# Patient Record
Sex: Male | Born: 1960
Health system: Southern US, Community
[De-identification: ages and names within clinical notes are randomized; demographics above are authoritative.]

## PROBLEM LIST (undated history)

## (undated) DIAGNOSIS — K219 Gastro-esophageal reflux disease without esophagitis: Secondary | ICD-10-CM

## (undated) DIAGNOSIS — N200 Calculus of kidney: Secondary | ICD-10-CM

## (undated) DIAGNOSIS — R1310 Dysphagia, unspecified: Secondary | ICD-10-CM

## (undated) DIAGNOSIS — R300 Dysuria: Secondary | ICD-10-CM

## (undated) DIAGNOSIS — Z87442 Personal history of urinary calculi: Secondary | ICD-10-CM

## (undated) DIAGNOSIS — Z8709 Personal history of other diseases of the respiratory system: Secondary | ICD-10-CM

## (undated) DIAGNOSIS — F32A Depression, unspecified: Secondary | ICD-10-CM

## (undated) DIAGNOSIS — R319 Hematuria, unspecified: Secondary | ICD-10-CM

## (undated) DIAGNOSIS — Z9889 Other specified postprocedural states: Secondary | ICD-10-CM

---

## 1998-11-23 ENCOUNTER — Inpatient Hospital Stay (HOSPITAL_COMMUNITY): Admission: EM | Admit: 1998-11-23 | Discharge: 1998-11-27 | Payer: Self-pay | Admitting: Emergency Medicine

## 1998-11-23 ENCOUNTER — Encounter: Payer: Self-pay | Admitting: General Surgery

## 1998-11-23 ENCOUNTER — Encounter: Payer: Self-pay | Admitting: Emergency Medicine

## 1998-11-24 ENCOUNTER — Encounter: Payer: Self-pay | Admitting: General Surgery

## 1998-11-24 ENCOUNTER — Encounter: Payer: Self-pay | Admitting: Emergency Medicine

## 1998-11-25 ENCOUNTER — Encounter: Payer: Self-pay | Admitting: General Surgery

## 1998-11-26 ENCOUNTER — Encounter: Payer: Self-pay | Admitting: General Surgery

## 1998-11-27 ENCOUNTER — Encounter: Payer: Self-pay | Admitting: General Surgery

## 2006-08-11 ENCOUNTER — Ambulatory Visit (HOSPITAL_COMMUNITY): Admission: RE | Admit: 2006-08-11 | Discharge: 2006-08-11 | Payer: Self-pay | Admitting: General Surgery

## 2006-08-11 HISTORY — PX: ANAL FISSURE REPAIR: SHX2312

## 2012-02-20 ENCOUNTER — Ambulatory Visit: Admit: 2012-02-20 | Payer: Self-pay | Admitting: Urology

## 2012-02-20 ENCOUNTER — Emergency Department (HOSPITAL_COMMUNITY)
Admission: EM | Admit: 2012-02-20 | Discharge: 2012-02-20 | Disposition: A | Discharge status: Home / Self Care | Payer: 59 | Attending: Urology | Admitting: Urology

## 2012-02-20 ENCOUNTER — Other Ambulatory Visit: Payer: Self-pay | Admitting: Urology

## 2012-02-20 ENCOUNTER — Encounter (HOSPITAL_COMMUNITY): Admission: EM | Disposition: A | Discharge status: Home / Self Care | Payer: Self-pay | Source: Home / Self Care

## 2012-02-20 ENCOUNTER — Encounter (HOSPITAL_COMMUNITY): Payer: Self-pay | Admitting: Anesthesiology

## 2012-02-20 ENCOUNTER — Emergency Department (HOSPITAL_COMMUNITY): Payer: 59 | Admitting: Anesthesiology

## 2012-02-20 ENCOUNTER — Encounter (HOSPITAL_COMMUNITY): Payer: Self-pay | Admitting: *Deleted

## 2012-02-20 ENCOUNTER — Emergency Department (HOSPITAL_COMMUNITY): Payer: 59

## 2012-02-20 DIAGNOSIS — N1330 Unspecified hydronephrosis: Secondary | ICD-10-CM | POA: Insufficient documentation

## 2012-02-20 DIAGNOSIS — R112 Nausea with vomiting, unspecified: Secondary | ICD-10-CM | POA: Insufficient documentation

## 2012-02-20 DIAGNOSIS — N132 Hydronephrosis with renal and ureteral calculous obstruction: Secondary | ICD-10-CM

## 2012-02-20 DIAGNOSIS — N200 Calculus of kidney: Secondary | ICD-10-CM | POA: Insufficient documentation

## 2012-02-20 DIAGNOSIS — K5900 Constipation, unspecified: Secondary | ICD-10-CM | POA: Insufficient documentation

## 2012-02-20 DIAGNOSIS — R109 Unspecified abdominal pain: Secondary | ICD-10-CM | POA: Insufficient documentation

## 2012-02-20 DIAGNOSIS — N201 Calculus of ureter: Secondary | ICD-10-CM | POA: Insufficient documentation

## 2012-02-20 HISTORY — PX: OTHER SURGICAL HISTORY: SHX169

## 2012-02-20 LAB — SURGICAL PCR SCREEN: MRSA, PCR: NEGATIVE

## 2012-02-20 LAB — COMPREHENSIVE METABOLIC PANEL
ALT: 15 U/L (ref 0–53)
AST: 15 U/L (ref 0–37)
Albumin: 3.9 g/dL (ref 3.5–5.2)
Alkaline Phosphatase: 64 U/L (ref 39–117)
BUN: 15 mg/dL (ref 6–23)
CO2: 23 mEq/L (ref 19–32)
Calcium: 9.4 mg/dL (ref 8.4–10.5)
Chloride: 101 mEq/L (ref 96–112)
Creatinine, Ser: 1.33 mg/dL (ref 0.50–1.35)
GFR calc Af Amer: 72 mL/min — ABNORMAL LOW (ref >90)
GFR calc non Af Amer: 62 mL/min — ABNORMAL LOW (ref >90)
Glucose, Bld: 132 mg/dL — ABNORMAL HIGH (ref 70–99)
Potassium: 3.7 mEq/L (ref 3.5–5.1)
Sodium: 139 mEq/L (ref 135–145)
Total Bilirubin: 0.7 mg/dL (ref 0.3–1.2)
Total Protein: 6.9 g/dL (ref 6.0–8.3)

## 2012-02-20 LAB — DIFFERENTIAL
Basophils Absolute: 0 10*3/uL (ref 0.0–0.1)
Basophils Relative: 0 % (ref 0–1)
Eosinophils Absolute: 0 10*3/uL (ref 0.0–0.7)
Eosinophils Relative: 0 % (ref 0–5)
Lymphocytes Relative: 8 % — ABNORMAL LOW (ref 12–46)
Lymphs Abs: 0.9 10*3/uL (ref 0.7–4.0)
Monocytes Absolute: 1 10*3/uL (ref 0.1–1.0)
Monocytes Relative: 9 % (ref 3–12)
Neutro Abs: 9.7 10*3/uL — ABNORMAL HIGH (ref 1.7–7.7)
Neutrophils Relative %: 83 % — ABNORMAL HIGH (ref 43–77)

## 2012-02-20 LAB — URINALYSIS, ROUTINE W REFLEX MICROSCOPIC
Bilirubin Urine: NEGATIVE
Glucose, UA: NEGATIVE mg/dL
Ketones, ur: 40 mg/dL — AB
Leukocytes, UA: NEGATIVE
Nitrite: NEGATIVE
Protein, ur: NEGATIVE mg/dL
Specific Gravity, Urine: 1.029 (ref 1.005–1.030)
Urobilinogen, UA: 0.2 mg/dL (ref 0.0–1.0)
pH: 5.5 (ref 5.0–8.0)

## 2012-02-20 LAB — CBC
HCT: 41.4 % (ref 39.0–52.0)
Hemoglobin: 14.3 g/dL (ref 13.0–17.0)
MCH: 30.2 pg (ref 26.0–34.0)
MCHC: 34.5 g/dL (ref 30.0–36.0)
MCV: 87.3 fL (ref 78.0–100.0)
Platelets: 289 10*3/uL (ref 150–400)
RBC: 4.74 MIL/uL (ref 4.22–5.81)
RDW: 12.1 % (ref 11.5–15.5)
WBC: 11.6 10*3/uL — ABNORMAL HIGH (ref 4.0–10.5)

## 2012-02-20 LAB — URINE MICROSCOPIC-ADD ON

## 2012-02-20 SURGERY — CYSTOURETEROSCOPY, WITH RETROGRADE PYELOGRAM AND STENT INSERTION
Anesthesia: General | Site: Ureter | Laterality: Left | Wound class: Clean Contaminated

## 2012-02-20 MED ORDER — GLYCOPYRROLATE 0.2 MG/ML IJ SOLN
INTRAMUSCULAR | Status: DC | PRN
  Administered 2012-02-20: .6 mg via INTRAVENOUS

## 2012-02-20 MED ORDER — 0.9 % SODIUM CHLORIDE (POUR BTL) OPTIME
TOPICAL | Status: DC | PRN
  Administered 2012-02-20: 1000 mL

## 2012-02-20 MED ORDER — SULFAMETHOXAZOLE-TMP DS 800-160 MG PO TABS: 1.0000 | ORAL_TABLET | Freq: Two times a day (BID) | ORAL | Status: AC

## 2012-02-20 MED ORDER — SODIUM CHLORIDE 0.9 % IV SOLN
Freq: Once | INTRAVENOUS | Status: AC
  Administered 2012-02-20: 10 mL/h via INTRAVENOUS

## 2012-02-20 MED ORDER — FENTANYL CITRATE 0.05 MG/ML IJ SOLN: 25.0000 ug | INTRAMUSCULAR | Status: DC | PRN

## 2012-02-20 MED ORDER — HYDROMORPHONE HCL PF 1 MG/ML IJ SOLN
1.0000 mg | Freq: Once | INTRAMUSCULAR | Status: AC
  Administered 2012-02-20: 1 mg via INTRAVENOUS
  Filled 2012-02-20: qty 1

## 2012-02-20 MED ORDER — PROMETHAZINE HCL 25 MG/ML IJ SOLN: 6.2500 mg | INTRAMUSCULAR | Status: DC | PRN

## 2012-02-20 MED ORDER — CEFAZOLIN SODIUM 1-5 GM-% IV SOLN: 1.0000 g | INTRAVENOUS | Status: DC

## 2012-02-20 MED ORDER — IOHEXOL 300 MG/ML  SOLN
INTRAMUSCULAR | Status: DC | PRN
  Administered 2012-02-20: 50 mL

## 2012-02-20 MED ORDER — CEFAZOLIN SODIUM 1-5 GM-% IV SOLN
INTRAVENOUS | Status: AC
Start: 2012-02-20 — End: 2012-02-20
  Filled 2012-02-20: qty 50

## 2012-02-20 MED ORDER — ONDANSETRON HCL 4 MG/2ML IJ SOLN
INTRAMUSCULAR | Status: DC | PRN
  Administered 2012-02-20: 4 mg via INTRAVENOUS

## 2012-02-20 MED ORDER — PROPOFOL 10 MG/ML IV EMUL
INTRAVENOUS | Status: DC | PRN
  Administered 2012-02-20: 180 mg via INTRAVENOUS

## 2012-02-20 MED ORDER — BELLADONNA ALKALOIDS-OPIUM 16.2-60 MG RE SUPP
RECTAL | Status: DC | PRN
  Administered 2012-02-20: 1 via RECTAL

## 2012-02-20 MED ORDER — ONDANSETRON HCL 4 MG/2ML IJ SOLN
INTRAMUSCULAR | Status: AC
  Administered 2012-02-20: 4 mg via INTRAVENOUS
  Filled 2012-02-20: qty 2

## 2012-02-20 MED ORDER — DOCUSATE SODIUM 100 MG PO CAPS: 100.0000 mg | ORAL_CAPSULE | Freq: Two times a day (BID) | ORAL | Status: AC

## 2012-02-20 MED ORDER — LIDOCAINE HCL 2 % EX GEL
CUTANEOUS | Status: DC | PRN
  Administered 2012-02-20: 1 via URETHRAL

## 2012-02-20 MED ORDER — LACTATED RINGERS IV SOLN
INTRAVENOUS | Status: DC | PRN
  Administered 2012-02-20: 17:00:00 via INTRAVENOUS

## 2012-02-20 MED ORDER — SODIUM CHLORIDE 0.9 % IR SOLN
Status: DC | PRN
  Administered 2012-02-20: 6000 mL

## 2012-02-20 MED ORDER — FENTANYL CITRATE 0.05 MG/ML IJ SOLN
INTRAMUSCULAR | Status: DC | PRN
  Administered 2012-02-20: 100 ug via INTRAVENOUS

## 2012-02-20 MED ORDER — HYDROMORPHONE HCL PF 1 MG/ML IJ SOLN
1.0000 mg | Freq: Once | INTRAMUSCULAR | Status: AC
  Administered 2012-02-20: 1 mg via INTRAVENOUS

## 2012-02-20 MED ORDER — HYDROMORPHONE HCL PF 1 MG/ML IJ SOLN
INTRAMUSCULAR | Status: AC
  Administered 2012-02-20: 1 mg via INTRAVENOUS
  Filled 2012-02-20: qty 1

## 2012-02-20 MED ORDER — NEOSTIGMINE METHYLSULFATE 1 MG/ML IJ SOLN
INTRAMUSCULAR | Status: DC | PRN
  Administered 2012-02-20: 5 mg via INTRAVENOUS

## 2012-02-20 MED ORDER — ONDANSETRON HCL 4 MG/2ML IJ SOLN
4.0000 mg | Freq: Once | INTRAMUSCULAR | Status: AC
  Administered 2012-02-20: 4 mg via INTRAVENOUS

## 2012-02-20 MED ORDER — HYDROCODONE-ACETAMINOPHEN 5-325 MG PO TABS: 1.0000 | ORAL_TABLET | Freq: Four times a day (QID) | ORAL | Status: AC | PRN

## 2012-02-20 MED ORDER — ROCURONIUM BROMIDE 100 MG/10ML IV SOLN
INTRAVENOUS | Status: DC | PRN
  Administered 2012-02-20: 5 mg via INTRAVENOUS
  Administered 2012-02-20: 20 mg via INTRAVENOUS

## 2012-02-20 MED ORDER — LIDOCAINE HCL (CARDIAC) 20 MG/ML IV SOLN
INTRAVENOUS | Status: DC | PRN
  Administered 2012-02-20: 75 mg via INTRAVENOUS

## 2012-02-20 MED ORDER — MIDAZOLAM HCL 5 MG/5ML IJ SOLN
INTRAMUSCULAR | Status: DC | PRN
  Administered 2012-02-20: 2 mg via INTRAVENOUS

## 2012-02-20 MED ORDER — POLYETHYLENE GLYCOL 3350 17 G PO PACK: 17.0000 g | PACK | Freq: Every day | ORAL | Status: AC

## 2012-02-20 MED ORDER — KETOROLAC TROMETHAMINE 30 MG/ML IJ SOLN
30.0000 mg | Freq: Once | INTRAMUSCULAR | Status: AC
  Administered 2012-02-20: 30 mg via INTRAVENOUS
  Filled 2012-02-20: qty 1

## 2012-02-20 MED ORDER — CEFAZOLIN SODIUM 1-5 GM-% IV SOLN
INTRAVENOUS | Status: DC | PRN
  Administered 2012-02-20: 1 g via INTRAVENOUS

## 2012-02-20 SURGICAL SUPPLY — 19 items
BAG URO CATCHER STRL LF (DRAPE) ×3 IMPLANT
CATH URET 5FR 28IN CONE TIP (BALLOONS) ×1
CATH URET 5FR 28IN OPEN ENDED (CATHETERS) ×3 IMPLANT
CATH URET 5FR 70CM CONE TIP (BALLOONS) ×2 IMPLANT
CLOTH BEACON ORANGE TIMEOUT ST (SAFETY) ×3 IMPLANT
DRAPE CAMERA CLOSED 9X96 (DRAPES) ×3 IMPLANT
GLOVE BIOGEL M STRL SZ7.5 (GLOVE) ×3 IMPLANT
GLOVE BIOGEL PI IND STRL 7.0 (GLOVE) ×2 IMPLANT
GLOVE BIOGEL PI INDICATOR 7.0 (GLOVE) ×1
GLOVE SURG SS PI 7.0 STRL IVOR (GLOVE) ×3 IMPLANT
GLOVE SURG SS PI 8.0 STRL IVOR (GLOVE) IMPLANT
GOWN PREVENTION PLUS XLARGE (GOWN DISPOSABLE) ×3 IMPLANT
GOWN STRL REIN XL XLG (GOWN DISPOSABLE) ×3 IMPLANT
MANIFOLD NEPTUNE II (INSTRUMENTS) ×3 IMPLANT
MARKER SKIN DUAL TIP RULER LAB (MISCELLANEOUS) ×3 IMPLANT
PACK CYSTO (CUSTOM PROCEDURE TRAY) ×3 IMPLANT
SHEATH ACCESS URETERAL 38CM (SHEATH) ×3 IMPLANT
STENT CONTOUR 6FRX26X.038 (STENTS) ×3 IMPLANT
TUBING CONNECTING 10 (TUBING) ×3 IMPLANT

## 2012-02-20 NOTE — Discharge Instructions (Signed)
Ureteral Stent  A ureteral stent is a soft plastic tube with multiple holes. The stent is inserted into a ureter to help drain urine from the kidney into the bladder. The tube has a coil on each end to keep it from falling out. One end stays in the kidney. The other end stays in the bladder. A stent cannot be seen from the outside. Usually it does not keep you from going about normal routines.  A ureteral stent is used to bypass a blockage in your kidney or ureter. This blockage can be caused by kidney stones, scar tissue, pregnancy, or other causes. It can also be used during treatment to remove a kidney stone or to let a ureter heal after surgery. The stent allows urine to drain from the kidney into the bladder. It is most often taken out after the blockage has been removed or the ureter has healed. If a stent is needed for a long time, it will be changed every few months.  INSERTING THE STENT  Your stent is put in by a urologist. This is a medical doctor trained for treating genitourinary (kidney, ureter and bladder) problems. Before your stent is put in, your caregiver may order x-rays or other imaging tests of your kidneys and ureters. The stent is inserted in a hospital or same day surgical center. You can anticipate going home the same day.  PROCEDURE   A special x-ray machine called a fluoroscope is used to guide the insertion of your stent. This allows your doctor to make sure the stent is in the correct place.   First you are given anesthesia to keep you comfortable.   Then your doctor inserts a special lighted instrument called a cystoscope into your bladder. This allows your doctor to see the opening to the ureter.   A thin wire is carefully threaded into the bladder and up the ureter. The stent is inserted over the wire and the wire is then removed.  HOME CARE INSTRUCTIONS    While the stent is in place, you may feel some discomfort. Certain movements may trigger pain or a feeling that you need to  urinate. Your caregiver may give you pain medication. Only take over-the-counter or prescription medicines for pain, discomfort, or fever as directed by your caregiver. Do not take aspirin as this can make bleeding worse.   You may be given medications to prevent infection or bladder spasms. Be sure to take all medications as directed.   Drink plenty of fluids.   You may have small amounts of bleeding causing your urine to be slightly red. This is nothing to be concerned about.  REMOVAL OF THE STENT  Your stent is left in until the blockage is resolved. This may take two weeks or longer. Before the stent is removed, you may have an x-ray make sure the ureter is open. The stent can be removed by your caregiver in the office. Medications may be given for comfort. Be sure to keep all follow-up appointments so your caregiver can check that you are healing properly.  SEEK IMMEDIATE MEDICAL CARE IF:    Your urine is dark red or has blood clots.   You are incontinent (leaking urine).   You have an oral temperature above 102 F (38.9 C), chills, nausea (feeling sick to your stomach), or vomiting.   Your pain is not relieved by pain medication. Do not take aspirin as this can make bleeding worse.   The end of the stent comes   out of the urethra.  Document Released: 09/05/2000 Document Revised: 08/28/2011 Document Reviewed: 09/04/2008  ExitCare Patient Information 2012 [employer].

## 2012-02-20 NOTE — Anesthesia Postprocedure Evaluation (Signed)
  Anesthesia Post-op Note  Patient: Joe Higgins  Procedure(s) Performed: Procedure(s) (LRB): CYSTOSCOPY WITH RETROGRADE PYELOGRAM, URETEROSCOPY AND STENT PLACEMENT (Left)  Patient Location: PACU  Anesthesia Type: General  Level of Consciousness: awake and alert   Airway and Oxygen Therapy: Patient Spontanous Breathing  Post-op Pain: mild  Post-op Assessment: Post-op Vital signs reviewed, Patient's Cardiovascular Status Stable, Respiratory Function Stable, Patent Airway and No signs of Nausea or vomiting  Post-op Vital Signs: stable  Complications: No apparent anesthesia complications

## 2012-02-20 NOTE — ED Notes (Addendum)
Pt was seen at [facility] urology on Wednesday. Dx with large kidney stone and told "it might not pass" -- was given pain management and d/c from there to return today. Pain became unbearable without relief at 0530. Pt is unable to keep percocet down. Pt is rocking, moaning with pain on arrival. Accompanied by wife.

## 2012-02-20 NOTE — Transfer of Care (Signed)
Immediate Anesthesia Transfer of Care Note  Patient: Joe Higgins  Procedure(s) Performed: Procedure(s) (LRB): CYSTOSCOPY WITH RETROGRADE PYELOGRAM, URETEROSCOPY AND STENT PLACEMENT (Left)  Patient Location: PACU  Anesthesia Type: General  Level of Consciousness: awake, oriented and patient cooperative  Airway & Oxygen Therapy: Patient Spontanous Breathing and Patient connected to face mask oxygen  Post-op Assessment: Report given to PACU RN, Post -op Vital signs reviewed and stable and Patient moving all extremities X 4  Post vital signs: stable  Complications: No apparent anesthesia complications

## 2012-02-20 NOTE — Anesthesia Preprocedure Evaluation (Addendum)

## 2012-02-20 NOTE — Brief Op Note (Signed)
02/20/2012  6:29 PM  PATIENT:  Joe Higgins  51 y.o. male  PRE-OPERATIVE DIAGNOSIS:  left ureteral stone  POST-OPERATIVE DIAGNOSIS:  Left ureteral stone  PROCEDURE:  Procedure(s) (LRB): CYSTOSCOPY WITH RETROGRADE PYELOGRAM, URETEROSCOPY AND STENT PLACEMENT (Left)  SURGEON:  Surgeon(s) and Role:    * Antony Haste, MD - Primary   ANESTHESIA:   general  EBL:  Minimal  BLOOD ADMINISTERED:none  DRAINS: left ureteral stent  LOCAL MEDICATIONS USED:  OTHER Lidojet urethra, B&O PR  DICTATION: [id number]  PLAN OF CARE: Discharge to home after PACU  PATIENT DISPOSITION:  PACU - hemodynamically stable.   Delay start of Pharmacological VTE agent (>24hrs) due to surgical blood loss or risk of bleeding: not applicable

## 2012-02-20 NOTE — ED Provider Notes (Signed)
Medical screening examination/treatment/procedure(s) were performed by non-physician practitioner and as supervising physician I was immediately available for consultation/collaboration.  Lauri Till, MD 02/20/12 1558 

## 2012-02-20 NOTE — H&P (Signed)
History of Present Illness           New patient seen in consult for a 4 mm left ureteral stone referred by Christopher Lawyer PA from [facility] ER. Pt developed acute onset of left flank pain about two days ago. He was seen at [facility] emergency department in [place] while at a job and was diagnosed with a 4 mm left ureteral stone. This was reviewed from his official ER discharge paperwork. This is his fisrt stone. Pain is severe and intermittent. He started on tamsulosin and pain medicines. He's been having emesis today, has uncontrolled pain and been to the ER twice. He got toradol earlier.   He has no dysuria and AUASS = 0.   A kub was done Feb 20, 2012 which revealed no acute abnormality. Bowel gas obscures the renal shadows. No large calculi identified.  There are no other associated signs or symptoms. There are no other aggravating or alleviating factors.   Past Medical History Problems  1. History of  Heartburn 787.1 2. History of  Peptic Ulcer V12.71  Surgical History Problems  1. History of  Chest Tube Insertion 2. History of  Rectal Surgery  Current Meds 1. Flomax 0.4 MG Oral Capsule; Therapy: (Recorded:31May2013) to 2. Percocet TABS; Therapy: (Recorded:31May2013) to 3. Zofran 4 MG Oral Tablet; Therapy: (Recorded:31May2013) to  Allergies Medication  1. No Known Drug Allergies  Family History Problems  1. Family history of  Family Health Status - Father's Age 65 2. Family history of  Family Health Status - Mother's Age 62 3. Family history of  Family Health Status Number Of Children 2 daughters 4. Paternal history of  Prostate Cancer V16.42 mgf  Social History Problems    Caffeine Use 1   Marital History - Currently Married   Never A Smoker Denied    History of  Alcohol Use  Review of Systems Genitourinary, constitutional, skin, eye, otolaryngeal, hematologic/lymphatic, cardiovascular, pulmonary, endocrine,  musculoskeletal, gastrointestinal, neurological and psychiatric system(s) were reviewed and pertinent findings if present are noted.  Gastrointestinal: nausea, vomiting and constipation.  Neurological: headache.    Vitals Vital Signs [Data Includes: Last 1 Day]  31May2013 02:04PM  BMI Calculated: 23.85 BSA Calculated: 1.88 Height: 5 ft 9 in Weight: 161 lb  Blood Pressure: 122 / 71 Temperature: 97.8 F Heart Rate: 71 Respiration: 18  Physical Exam Constitutional: Well nourished and well developed . No acute distress. Rocking around and looks in pain.  ENT:. The ears and nose are normal in appearance.  Neck: The appearance of the neck is normal and no neck mass is present.  Pulmonary: No respiratory distress and normal respiratory rhythm and effort.  Cardiovascular: Heart rate and rhythm are normal . No peripheral edema.  Abdomen: The abdomen is soft and nontender. No masses are palpated. No CVA tenderness. No hernias are palpable. No hepatosplenomegaly noted.  Lymphatics: The femoral and inguinal nodes are not enlarged or tender.  Skin: Normal skin turgor, no visible rash and no visible skin lesions.  Neuro/Psych:. Mood and affect are appropriate.    Results/Data Urine [Data Includes: Last 1 Day]   31May2013  COLOR AMBER   APPEARANCE CLEAR   SPECIFIC GRAVITY 1.025   pH 5.5   GLUCOSE NEG mg/dL  BILIRUBIN NEG   KETONE 15 mg/dL  BLOOD LARGE   PROTEIN TRACE mg/dL  UROBILINOGEN 0.2 mg/dL  NITRITE NEG   LEUKOCYTE ESTERASE NEG   SQUAMOUS EPITHELIAL/HPF NONE SEEN   WBC 0-3   WBC/hpf  RBC 11-20 RBC/hpf  BACTERIA NONE SEEN   CRYSTALS Calcium Oxalate crystals noted   CASTS NONE SEEN    Old records or history reviewed: 3 pages + epic chart.    Discussion/Summary     Using the understanding kidney stone handout we discussed the nature , R/B of continued stone passage with MET, cysto/left RGP/ureteral stent poss URS/laserlitho, or ESWL. All questions answered. He elects to proceed  with cysto/left RGP/ureteral stent poss URS/laserlitho. We also discussed the side effects of the proposed treatment, the likelihood of the patient achieving the goals of the procedure, and any potential problems that might occur during the procedure or recuperation. We discussed the need for a staged procedure with stent placement followed by stone treatment in the future.  

## 2012-02-20 NOTE — ED Provider Notes (Signed)
History     CSN: [id number]  Arrival date & time 02/20/12  0818   First MD Initiated Contact with Patient 02/20/12 0831      No chief complaint on file.   (Consider location/radiation/quality/duration/timing/severity/associated sxs/prior treatment) HPI Patient presents emergency Dept. with significant left flank pain began around 4 AM.  Patient states he was seen at another hospital in [place] while on a job site and was diagnosed with a 4 mm kidney stone and sent home with pain control and antiemetics.  Patient states that his pain came unbearable around 5:30 in the troponin to the emergency department.  Patient denies difficulty urinating, fever, weakness, or diarrhea. The patient states that he has had some constipation as well. No past medical history on file.  No past surgical history on file.  No family history on file.  History  Substance Use Topics  . Smoking status: Not on file  . Smokeless tobacco: Not on file  . Alcohol Use: Not on file      Review of Systems All other systems negative except as documented in the HPI. All pertinent positives and negatives as reviewed in the HPI.  Allergies  Review of patient's allergies indicates no known allergies.  Home Medications   Current Outpatient Rx  Name Route Sig Dispense Refill  . MULTI-VITAMIN/MINERALS PO TABS Oral Take 1 tablet by mouth daily.    Marland Kitchen ONDANSETRON HCL 4 MG PO TABS Oral Take 4 mg by mouth every 6 (six) hours as needed. nausea    . OXYCODONE-ACETAMINOPHEN 5-325 MG PO TABS Oral Take 1 tablet by mouth every 4 (four) hours as needed. pain    . TAMSULOSIN HCL 0.4 MG PO CAPS Oral Take 0.4 mg by mouth daily.      BP 154/90  Pulse 91  Temp(Src) 97.7 F (36.5 C) (Oral)  Resp 24  Ht 5\' 9"  (1.753 m)  Wt 157 lb (71.215 kg)  BMI 23.18 kg/m2  SpO2 100%  Physical Exam  Nursing note and vitals reviewed. Constitutional: He is oriented to person, place, and time. He appears well-developed and  well-nourished. He appears distressed.  HENT:  Head: Normocephalic and atraumatic.  Eyes: Pupils are equal, round, and reactive to light.  Neck: Normal range of motion. Neck supple.  Cardiovascular: Normal rate, regular rhythm and normal heart sounds.   Pulmonary/Chest: Effort normal and breath sounds normal.  Abdominal: Soft. He exhibits no distension. There is tenderness. There is no rebound and no guarding.  Neurological: He is alert and oriented to person, place, and time.  Skin: Skin is warm and dry.    ED Course  Procedures (including critical care time)  Labs Reviewed  CBC - Abnormal; Notable for the following:    WBC 11.6 (*)    All other components within normal limits  DIFFERENTIAL - Abnormal; Notable for the following:    Neutrophils Relative 83 (*)    Neutro Abs 9.7 (*)    Lymphocytes Relative 8 (*)    All other components within normal limits  COMPREHENSIVE METABOLIC PANEL - Abnormal; Notable for the following:    Glucose, Bld 132 (*)    GFR calc non Af Amer 62 (*)    GFR calc Af Amer 72 (*)    All other components within normal limits  URINALYSIS, ROUTINE W REFLEX MICROSCOPIC - Abnormal; Notable for the following:    Hgb urine dipstick SMALL (*)    Ketones, ur 40 (*)    All other components within normal  limits  URINE MICROSCOPIC-ADD ON - Abnormal; Notable for the following:    Bacteria, UA FEW (*)    Crystals CA OXALATE CRYSTALS (*)    All other components within normal limits   Dg Abd 1 View  02/20/2012  *RADIOLOGY REPORT*  Clinical Data: Left-sided abdominal pain.  Left-sided kidney stone.  ABDOMEN - 1 VIEW  Comparison: None.  Findings: Gaseous distention of large and small bowel.  Prominent stool is present in the ascending colon.  The renal outlines are obscured by overlying bowel gas.  There is a small calcification in the right upper quadrant superior to the right eleventh rib which is nonspecific.  IMPRESSION: No acute abnormality.  Bowel gas obscures the  renal shadows.  No large calculi identified.  Original Report Authenticated By: Andreas Newport, M.D.   I spoke with Dr. Mena Goes from urology and he advised a plain film would be ultimately be needed at this point.  If his pain is controlled to be stable he most likely can be discharged.    12:32 PM Patient is completely pain-free at this time.  I spoke with [facility], who will see the patient at 1:30 today.   MDM  MDM Reviewed: nursing note and vitals Reviewed previous: CT scan and labs Interpretation: labs and x-ray Consults: urology            Carlyle Dolly, PA-C 02/20/12 1253

## 2012-02-20 NOTE — Anesthesia Procedure Notes (Signed)
Procedure Name: Intubation Date/Time: 02/20/2012 5:50 PM Performed by: Trellis Paganini Oxygen Delivery Method: Circle system utilized Preoxygenation: Pre-oxygenation with 100% oxygen Intubation Type: IV induction and Cricoid Pressure applied Laryngoscope Size: Mac and 4 Grade View: Grade I Tube type: Oral Tube size: 7.5 mm Number of attempts: 1 Placement Confirmation: ETT inserted through vocal cords under direct vision,  breath sounds checked- equal and bilateral and positive ETCO2 Secured at: 21 cm Tube secured with: Tape Dental Injury: Teeth and Oropharynx as per pre-operative assessment

## 2012-02-20 NOTE — ED Notes (Signed)
Patient given a urinal and made aware of need for urine sample.  

## 2012-02-21 NOTE — Op Note (Signed)
NAMEYANI, LAL NO.:  [id number]  MEDICAL RECORD NO.:  [id number]  LOCATION:  [facility]                         FACILITY:  [facility]  PHYSICIAN:  Jerilee Field, MD   DATE OF BIRTH:  1961/02/14  DATE OF PROCEDURE: DATE OF DISCHARGE:  02/20/2012                              OPERATIVE REPORT   PREOPERATIVE DIAGNOSIS:  Left ureteral stone.  POSTOPERATIVE DIAGNOSIS:  Left ureteral stone.  PROCEDURE:  Cystoscopy with left retrograde pyelogram, left ureteroscopy, and left ureteral stent placement.  SURGEON:  Jerilee Field, MD  ANESTHESIA:  General.  INDICATION FOR PROCEDURE:  Mr. Rowe is a 51 year old, white male, who developed left flank pain approximately 2 days ago.  He has been to the emergency room twice with uncontrolled pain.  He had nausea and vomiting today.  The CT scan revealed a 4 mm left ureteral stone.  I discussed with the patient the nature, risks, benefits of cystoscopy, left ureteral stent placement possible ureteroscopy, shockwave lithotripsy, or continued stone passage with medical expulsion therapy.  All questions answered.  The patient elected to proceed with the endoscopic management.  FINDINGS: 1. On exam under anesthesia, the patient had a normal penis and     testicles without hard areas or mass.  He also had a normal digital     rectal exam with a mild prostate enlargement, but no hard area or     nodule.  On cystoscopy, the urethra and bladder were normal. 2. Left retrograde pyelogram.  The left ureter was normal in     appearance apart from a 4 mm filling defect in the left proximal     ureter consistent with the known ureteral stone.  This was just     adjacent to L4 vertebral body.  Proximal to this, there was mild     dilation of the ureter, but a normal complement of upper, middle,     and lower pole calices, and a normal renal pelvis without filling     defect.  DESCRIPTION OF PROCEDURE:  After consent was obtained, the  patient brought to the operating room.  A time-out was performed to confirm the patient and procedure.  After adequate anesthesia, he was placed in lithotomy position.  An exam under anesthesia was performed.  The findings as previously dictated.  He was then prepped and draped in the usual fashion and a rigid cystoscope passed per urethra.  The left ureteral orifice was quite tight.  Therefore, using a cone-tipped catheter, left retrograde pyelogram was obtained with the left retrograde injection of contrast.  Following this, a Sensor wire was advanced up to the level of the stone where it met significant resistance.  The Sensor wire would not pass through the proximal ureter at the location of the stone and the backload of 6-French open-ended catheter over the wire to brace the wire when the wire then finely sprung through and went into the mid pole of the kidney.  Contrast was in the kidney, pelvis and normal proximal ureter which confirmed wire placement.  Next, I tried to advance the semirigid ureteroscope, but the ureteral orifice was too tight.  Therefore, the inner cannula of  an access sheath was used to dilate the ureteral orifice, but this would not pass proximal to the iliac vessels.  The ureter took quite a turn at the iliac vessels.  The semirigid ureteroscope was advanced back into the distal ureter and I was able to go up to the level of the iliacs, but again ureter was too tight and the scope would not pass.  The cystoscope and dilation did not pass at his point.  I then want to force an access sheath up.  Therefore, the wire was backloaded on the cystoscope and a 6 x 26 cm stent was placed.  The wire was removed and a good coil was seen in the kidney and a good coil in the bladder.  The patient's bladder was drained.  Scope was removed.  He was then awakened and taken to the recovery room in stable condition.  ESTIMATED BLOOD LOSS:  Minimal.  COMPLICATIONS:   None.  DRAINS:  Left ureteral stent.  DISPOSITION:  The patient is stable to PACU.          ______________________________ Jerilee Field, MD     ME/MEDQ  D:  02/20/2012  T:  02/21/2012  Job:  [id number]

## 2012-02-23 MED FILL — Mupirocin Oint 2%: CUTANEOUS | Qty: 22 | Status: AC

## 2012-02-24 NOTE — OR Nursing (Signed)
I added the stent to the charges. It was documented in the [facility] section but not supplies.

## 2012-03-03 ENCOUNTER — Encounter (HOSPITAL_BASED_OUTPATIENT_CLINIC_OR_DEPARTMENT_OTHER): Payer: Self-pay | Admitting: *Deleted

## 2012-03-03 ENCOUNTER — Other Ambulatory Visit: Payer: Self-pay | Admitting: Urology

## 2012-03-05 ENCOUNTER — Encounter (HOSPITAL_BASED_OUTPATIENT_CLINIC_OR_DEPARTMENT_OTHER): Payer: Self-pay | Admitting: *Deleted

## 2012-03-05 NOTE — Progress Notes (Signed)
NPO AFTER MN. ARRIVES AT 0615. NEEDS HG AND EKG. MAY TAKE RX PAIN/ NAUSEA IF NEEDED W/ SIPS OF WATER.

## 2012-03-08 NOTE — H&P (Signed)
History of Present Illness   F/u ureteral stone (4 mm left ureteral stone) referred by Charlestine Night PA from [facility] ER. Pt developed acute onset of left flank pain May 2013. He was seen at [facility] emergency department in [place] while at a job and was diagnosed with a 4 mm left ureteral stone. This was reviewed from his official ER discharge paperwork. This is his first stone.   A kub was done Feb 20, 2012 which revealed no acute abnormality. Bowel gas obscures the renal shadows. No large calculi identified.  Taken to OR Feb 20, 2012 for left RGP, left ureteral stent. Stone not visible on scout again due to constipation and bowel gas. He had a normal exam under anesthesia/DRE  KUB March 02, 2012 -  Left ureteral stent is in good position, stone may be in left UP. Normal bowel gas pattern. Normal bones.   Past Medical History Problems  1. History of  Heartburn 787.1 2. History of  Peptic Ulcer V12.71  Surgical History Problems  1. History of  Chest Tube Insertion 2. History of  Cystoscopy With Insertion Of Ureteral Stent Left 3. History of  Cystoscopy With Ureteroscopy Left 4. History of  Rectal Surgery  Current Meds 1. Hydrocodone-Acetaminophen 5-325 MG Oral Tablet; Therapy: 01Jun2013 to  Allergies Medication  1. No Known Drug Allergies  Family History Problems  1. Family history of  Family Health Status - Father's Age 79 2. Family history of  Family Health Status - Mother's Age 51 3. Family history of  Family Health Status Number Of Children 2 daughters 4. Paternal history of  Prostate Cancer V16.42 mgf  Social History Problems  1. Caffeine Use 1 2. Marital History - Currently Married 3. Never A Smoker Denied  4. History of  Alcohol Use  Review of Systems Constitutional, cardiovascular and pulmonary system(s) were reviewed and pertinent findings if present are noted.  Musculoskeletal: back pain.    Vitals Vital Signs  [Data Includes: Last 1 Day]  11Jun2013 02:58PM  Blood Pressure: 123 / 74 Temperature: 97.7 F Heart Rate: 74  Physical Exam Constitutional: Well nourished and well developed . No acute distress.  Pulmonary: No respiratory distress and normal respiratory rhythm and effort.  Cardiovascular: Heart rate and rhythm are normal . No peripheral edema.  Neuro/Psych:. Mood and affect are appropriate.    Results/Data Urine [Data Includes: Last 1 Day]   11Jun2013  COLOR AMBER   APPEARANCE CLOUDY   SPECIFIC GRAVITY 1.025   pH 6.0   GLUCOSE NEG mg/dL  BILIRUBIN NEG   KETONE NEG mg/dL  BLOOD LARGE   PROTEIN 100 mg/dL  UROBILINOGEN 0.2 mg/dL  NITRITE NEG   LEUKOCYTE ESTERASE SMALL   SQUAMOUS EPITHELIAL/HPF FEW   WBC 4-6 WBC/hpf  RBC TNTC RBC/hpf  BACTERIA FEW   CRYSTALS NONE SEEN   CASTS NONE SEEN    Assessment Assessed  1. Nephrolithiasis 592.0 2. Pyuria 791.9  Plan  Health Maintenance (V70.0)  1. UA With REFLEX  Done: 11Jun2013 02:31PM Nephrolithiasis (592.0)  2. Follow-up Schedule Surgery Office  Follow-up  Requested for: 11Jun2013  URINE CULTURE  Status: In Progress - Specimen/Data Collected  Done: 11Jun2013 Ordered Today; For: Nephrolithiasis (592.0), Pyuria (791.9); Ordered By: Jamie Kato  Due: 13Jun2013 Marked Important; Last Updated By: Norberto Sorenson   Discussion/Summary      I believe I can see the stone on the KUB but it is on the lower limit of a stone  that is visible for confident shockwave lithotripsy. Using the understanding kidney stones handout we discussed the nature risks and benefits to nontreatment/removing the stent, left ureteroscopy laser lithotripsy, or left shockwave lithotripsy. All questions answered and they agree to proceed with left ureteroscopy laser lithotripsy and stent placement. All questions answered. We'll set this up for the near future.  Pt is here with his wife. We also discussed right renal stone is rather high on the  KUB up above the 11th rib. Therefore asked them to get a copy of his CT images from [facility] so that I may review the images. Urine Cx was negative.

## 2012-03-09 ENCOUNTER — Ambulatory Visit (HOSPITAL_BASED_OUTPATIENT_CLINIC_OR_DEPARTMENT_OTHER): Payer: Managed Care, Other (non HMO) | Admitting: Anesthesiology

## 2012-03-09 ENCOUNTER — Encounter (HOSPITAL_BASED_OUTPATIENT_CLINIC_OR_DEPARTMENT_OTHER): Payer: Self-pay | Admitting: Anesthesiology

## 2012-03-09 ENCOUNTER — Encounter (HOSPITAL_BASED_OUTPATIENT_CLINIC_OR_DEPARTMENT_OTHER): Payer: Self-pay | Admitting: *Deleted

## 2012-03-09 ENCOUNTER — Encounter (HOSPITAL_BASED_OUTPATIENT_CLINIC_OR_DEPARTMENT_OTHER)
Admission: RE | Disposition: A | Discharge status: Home / Self Care | Payer: Self-pay | Source: Ambulatory Visit | Attending: Urology

## 2012-03-09 ENCOUNTER — Ambulatory Visit (HOSPITAL_BASED_OUTPATIENT_CLINIC_OR_DEPARTMENT_OTHER)
Admission: RE | Admit: 2012-03-09 | Discharge: 2012-03-09 | Disposition: A | Discharge status: Home / Self Care | Payer: Managed Care, Other (non HMO) | Source: Ambulatory Visit | Attending: Urology | Admitting: Urology

## 2012-03-09 DIAGNOSIS — R12 Heartburn: Secondary | ICD-10-CM | POA: Insufficient documentation

## 2012-03-09 DIAGNOSIS — Z79899 Other long term (current) drug therapy: Secondary | ICD-10-CM | POA: Insufficient documentation

## 2012-03-09 DIAGNOSIS — Z8711 Personal history of peptic ulcer disease: Secondary | ICD-10-CM | POA: Insufficient documentation

## 2012-03-09 DIAGNOSIS — N201 Calculus of ureter: Secondary | ICD-10-CM | POA: Insufficient documentation

## 2012-03-09 HISTORY — DX: Calculus of kidney: N20.0

## 2012-03-09 HISTORY — DX: Dysuria: R30.0

## 2012-03-09 HISTORY — DX: Gastro-esophageal reflux disease without esophagitis: K21.9

## 2012-03-09 HISTORY — DX: Hematuria, unspecified: R31.9

## 2012-03-09 LAB — POCT HEMOGLOBIN-HEMACUE: Hemoglobin: 16 g/dL (ref 13.0–17.0)

## 2012-03-09 SURGERY — CYSTOURETEROSCOPY, WITH RETROGRADE PYELOGRAM AND STENT INSERTION
Anesthesia: General | Site: Ureter | Laterality: Left | Wound class: Clean Contaminated

## 2012-03-09 MED ORDER — IOHEXOL 350 MG/ML SOLN
INTRAVENOUS | Status: DC | PRN
  Administered 2012-03-09: 1 mL via INTRAVENOUS

## 2012-03-09 MED ORDER — OXYCODONE-ACETAMINOPHEN 5-325 MG PO TABS: 1.0000 | ORAL_TABLET | ORAL | Status: AC | PRN

## 2012-03-09 MED ORDER — CEFAZOLIN SODIUM 1-5 GM-% IV SOLN
1.0000 g | INTRAVENOUS | Status: AC
  Administered 2012-03-09: 1 g via INTRAVENOUS

## 2012-03-09 MED ORDER — PROPOFOL 10 MG/ML IV EMUL
INTRAVENOUS | Status: DC | PRN
  Administered 2012-03-09: 200 mg via INTRAVENOUS

## 2012-03-09 MED ORDER — LIDOCAINE HCL (CARDIAC) 20 MG/ML IV SOLN
INTRAVENOUS | Status: DC | PRN
  Administered 2012-03-09: 75 mg via INTRAVENOUS

## 2012-03-09 MED ORDER — OXYCODONE-ACETAMINOPHEN 5-325 MG PO TABS
1.0000 | ORAL_TABLET | ORAL | Status: DC | PRN
  Administered 2012-03-09: 1 via ORAL

## 2012-03-09 MED ORDER — CIPROFLOXACIN HCL 250 MG PO TABS: 250.0000 mg | ORAL_TABLET | Freq: Two times a day (BID) | ORAL | Status: AC

## 2012-03-09 MED ORDER — FENTANYL CITRATE 0.05 MG/ML IJ SOLN: 25.0000 ug | INTRAMUSCULAR | Status: DC | PRN

## 2012-03-09 MED ORDER — LACTATED RINGERS IV SOLN
INTRAVENOUS | Status: DC
  Administered 2012-03-09 (×3): via INTRAVENOUS

## 2012-03-09 MED ORDER — DEXAMETHASONE SODIUM PHOSPHATE 4 MG/ML IJ SOLN
INTRAMUSCULAR | Status: DC | PRN
  Administered 2012-03-09: 10 mg via INTRAVENOUS

## 2012-03-09 MED ORDER — FENTANYL CITRATE 0.05 MG/ML IJ SOLN
INTRAMUSCULAR | Status: DC | PRN
  Administered 2012-03-09: 50 ug via INTRAVENOUS
  Administered 2012-03-09 (×3): 25 ug via INTRAVENOUS
  Administered 2012-03-09: 50 ug via INTRAVENOUS
  Administered 2012-03-09: 25 ug via INTRAVENOUS

## 2012-03-09 MED ORDER — SODIUM CHLORIDE 0.9 % IR SOLN
Status: DC | PRN
  Administered 2012-03-09: 6000 mL

## 2012-03-09 MED ORDER — ONDANSETRON HCL 4 MG/2ML IJ SOLN
INTRAMUSCULAR | Status: DC | PRN
  Administered 2012-03-09: 4 mg via INTRAVENOUS

## 2012-03-09 SURGICAL SUPPLY — 33 items
ADAPTER CATH URET PLST 4-6FR (CATHETERS) IMPLANT
BAG DRAIN URO-CYSTO SKYTR STRL (DRAIN) ×3 IMPLANT
BASKET LASER NITINOL 1.9FR (BASKET) IMPLANT
BASKET STNLS GEMINI 4WIRE 3FR (BASKET) ×3 IMPLANT
BASKET ZERO TIP NITINOL 2.4FR (BASKET) ×3 IMPLANT
BRUSH URET BIOPSY 3F (UROLOGICAL SUPPLIES) IMPLANT
CANISTER SUCT LVC 12 LTR MEDI- (MISCELLANEOUS) ×3 IMPLANT
CATH INTERMIT  6FR 70CM (CATHETERS) ×3 IMPLANT
CATH URET 5FR 28IN CONE TIP (BALLOONS)
CATH URET 5FR 28IN OPEN ENDED (CATHETERS) IMPLANT
CATH URET 5FR 70CM CONE TIP (BALLOONS) IMPLANT
CLOTH BEACON ORANGE TIMEOUT ST (SAFETY) ×3 IMPLANT
DRAPE CAMERA CLOSED 9X96 (DRAPES) ×3 IMPLANT
ELECT REM PT RETURN 9FT ADLT (ELECTROSURGICAL)
ELECTRODE REM PT RTRN 9FT ADLT (ELECTROSURGICAL) IMPLANT
GLOVE BIO SURGEON STRL SZ7.5 (GLOVE) ×3 IMPLANT
GLOVE INDICATOR 6.5 STRL GRN (GLOVE) ×12 IMPLANT
GOWN PREVENTION PLUS LG XLONG (DISPOSABLE) ×3 IMPLANT
GOWN STRL REIN XL XLG (GOWN DISPOSABLE) ×3 IMPLANT
GUIDEWIRE 0.038 PTFE COATED (WIRE) IMPLANT
GUIDEWIRE ANG ZIPWIRE 038X150 (WIRE) IMPLANT
GUIDEWIRE STR DUAL SENSOR (WIRE) ×6 IMPLANT
IV NS IRRIG 3000ML ARTHROMATIC (IV SOLUTION) ×6 IMPLANT
KIT BALLIN UROMAX 15FX10 (LABEL) IMPLANT
KIT BALLN UROMAX 15FX4 (MISCELLANEOUS) IMPLANT
KIT BALLN UROMAX 26 75X4 (MISCELLANEOUS)
PACK CYSTOSCOPY (CUSTOM PROCEDURE TRAY) ×3 IMPLANT
SET HIGH PRES BAL DIL (LABEL)
SHEATH ACCESS URETERAL 38CM (SHEATH) ×3 IMPLANT
SHEATH URET ACCESS 12FR/35CM (UROLOGICAL SUPPLIES) ×3 IMPLANT
SHEATH URET ACCESS 12FR/55CM (UROLOGICAL SUPPLIES) IMPLANT
STENT URET 6FRX24 CONTOUR (STENTS) ×3 IMPLANT
SYRINGE IRR TOOMEY STRL 70CC (SYRINGE) IMPLANT

## 2012-03-09 NOTE — Brief Op Note (Addendum)
03/09/2012  8:32 AM  PATIENT:  Joe Higgins  51 y.o. male  PRE-OPERATIVE DIAGNOSIS:  LEFT NEPHROLITHIASIS  POST-OPERATIVE DIAGNOSIS:  LEFT NEPHROLITHIASIS  PROCEDURE:  Procedure(s) (LRB): CYSTOSCOPY WITH LEFT URETEROSCOPY, stone basket  AND STENT PLACEMENT  SURGEON:  Surgeon(s) and Role:    * Antony Haste, MD - Primary  ANESTHESIA:   general   DRAINS: 6 x 24 cm left ureteral stent  SPECIMEN:  Source of Specimen:  stone, left kidney and ureter  DISPOSITION OF SPECIMEN:  office lab  PLAN OF CARE: Discharge to home after PACU  PATIENT DISPOSITION:  PACU - hemodynamically stable.   Delay start of Pharmacological VTE agent (>24hrs) due to surgical blood loss or risk of bleeding: not applicable  #[id number]

## 2012-03-09 NOTE — Anesthesia Preprocedure Evaluation (Signed)
Anesthesia Evaluation  Patient identified by MRN, date of birth, ID band Patient awake    Reviewed: Allergy & Precautions, H&P , NPO status , Patient's Chart, lab work & pertinent test results, reviewed documented beta blocker date and time   Airway Mallampati: II TM Distance: >3 FB Neck ROM: Full    Dental  (+) Teeth Intact and Dental Advisory Given   Pulmonary neg pulmonary ROS,  breath sounds clear to auscultation        Cardiovascular negative cardio ROS  Rhythm:Regular Rate:Normal  Denies cardiac symptoms   Neuro/Psych negative neurological ROS  negative psych ROS   GI/Hepatic negative GI ROS, Neg liver ROS,   Endo/Other  negative endocrine ROS  Renal/GU Kidney stone  negative genitourinary   Musculoskeletal negative musculoskeletal ROS (+)   Abdominal   Peds negative pediatric ROS (+)  Hematology negative hematology ROS (+)   Anesthesia Other Findings   Reproductive/Obstetrics negative OB ROS                           Anesthesia Physical Anesthesia Plan  ASA: I  Anesthesia Plan: General   Post-op Pain Management:    Induction: Intravenous  Airway Management Planned: LMA  Additional Equipment:   Intra-op Plan:   Post-operative Plan: Extubation in OR  Informed Consent: I have reviewed the patients History and Physical, chart, labs and discussed the procedure including the risks, benefits and alternatives for the proposed anesthesia with the patient or authorized representative who has indicated his/her understanding and acceptance.   Dental advisory given  Plan Discussed with: CRNA and Surgeon  Anesthesia Plan Comments:         Anesthesia Quick Evaluation

## 2012-03-09 NOTE — Interval H&P Note (Signed)
History and Physical Interval Note:  03/09/2012 7:32 AM  Joe Higgins  has presented today for surgery, with the diagnosis of LEFT NEPHROLITHIASIS  The various methods of treatment have been discussed with the patient and family. After consideration of risks, benefits and other options for treatment, the patient has consented to  Procedure(s) (LRB): URETEROSCOPY (Left) HOLMIUM LASER APPLICATION (Left) as a surgical intervention .  The patient's history has been reviewed, patient examined, no change in status, stable for surgery.  I have reviewed the patients' chart and labs.  Questions were answered to the patient's satisfaction.     Antony Haste

## 2012-03-09 NOTE — Anesthesia Postprocedure Evaluation (Signed)
  Anesthesia Post-op Note  Patient: Joe Higgins  Procedure(s) Performed: Procedure(s) (LRB): CYSTOSCOPY WITH RETROGRADE PYELOGRAM, URETEROSCOPY AND STENT PLACEMENT ()  Patient Location: PACU  Anesthesia Type: General  Level of Consciousness: oriented and sedated  Airway and Oxygen Therapy: Patient Spontanous Breathing  Post-op Pain: mild  Post-op Assessment: Post-op Vital signs reviewed, Patient's Cardiovascular Status Stable, Respiratory Function Stable and Patent Airway  Post-op Vital Signs: stable  Complications: No apparent anesthesia complications

## 2012-03-09 NOTE — Anesthesia Procedure Notes (Signed)
Procedure Name: LMA Insertion Date/Time: 03/09/2012 7:38 AM Performed by: Fran Lowes Pre-anesthesia Checklist: Patient identified, Emergency Drugs available, Suction available and Patient being monitored Patient Re-evaluated:Patient Re-evaluated prior to inductionOxygen Delivery Method: Circle System Utilized Preoxygenation: Pre-oxygenation with 100% oxygen Intubation Type: IV induction Ventilation: Mask ventilation without difficulty LMA: LMA inserted LMA Size: 4.0 Number of attempts: 1 Airway Equipment and Method: bite block Placement Confirmation: positive ETCO2 Tube secured with: Tape Dental Injury: Teeth and Oropharynx as per pre-operative assessment  Comments: LMA inserted by Dr. Shireen Quan.

## 2012-03-09 NOTE — Discharge Instructions (Signed)
Ureteral Stent A ureteral stent is a soft plastic tube with multiple holes. The stent is inserted into a ureter to help drain urine from the kidney into the bladder. The tube has a coil on each end to keep it from falling out. One end stays in the kidney. The other end stays in the bladder. A stent cannot be seen from the outside. Usually it does not keep you from going about normal routines. A ureteral stent is used to bypass a blockage in your kidney or ureter. This blockage can be caused by kidney stones, scar tissue, pregnancy, or other causes. It can also be used during treatment to remove a kidney stone or to let a ureter heal after surgery. The stent allows urine to drain from the kidney into the bladder. It is most often taken out after the blockage has been removed or the ureter has healed.  HOME CARE INSTRUCTIONS   While the stent is in place, you may feel some discomfort. Certain movements may trigger pain or a feeling that you need to urinate. Your caregiver may give you pain medication. Only take over-the-counter or prescription medicines for pain, discomfort, or fever as directed by your caregiver. Do not take aspirin as this can make bleeding worse.   You may be given medications to prevent infection or bladder spasms. Be sure to take all medications as directed.   Drink plenty of fluids.   You may have small amounts of bleeding causing your urine to be slightly red. This is nothing to be concerned about.  REMOVAL OF THE STENT ***Remove the stent by pulling the string with slow, steady pressure on Monday, March 15, 2012 SEEK IMMEDIATE MEDICAL CARE IF:   Your urine is dark red or has blood clots.   You are incontinent (leaking urine).   You have an oral temperature above 102 F (38.9 C), chills, nausea (feeling sick to your stomach), or vomiting.   Your pain is not relieved by pain medication. Do not take aspirin as this can make bleeding worse.   The end of the stent comes out  of the urethra.   Post Anesthesia Home Care Instructions  Activity: Get plenty of rest for the remainder of the day. A responsible adult should stay with you for 24 hours following the procedure.  For the next 24 hours, DO NOT: -Drive a car -Advertising copywriter -Drink alcoholic beverages -Take any medication unless instructed by your physician -Make any legal decisions or sign important papers.  Meals: Start with liquid foods such as gelatin or soup. Progress to regular foods as tolerated. Avoid greasy, spicy, heavy foods. If nausea and/or vomiting occur, drink only clear liquids until the nausea and/or vomiting subsides. Call your physician if vomiting continues.  Special Instructions/Symptoms: Your throat may feel dry or sore from the anesthesia or the breathing tube placed in your throat during surgery. If this causes discomfort, gargle with warm salt water. The discomfort should disappear within 24 hours.  CYSTOSCOPY HOME CARE INSTRUCTIONS  Activity: Rest for the remainder of the day.  Do not drive or operate equipment today.  You may resume normal activities in one to two days as instructed by your physician.   Meals: Drink plenty of liquids and eat light foods such as gelatin or soup this evening.  You may return to a normal meal plan tomorrow.  Return to Work: You may return to work in one to two days or as instructed by your physician.  Special Instructions /  Symptoms: Call your physician if any of these symptoms occur:   -persistent or heavy bleeding  -bleeding which continues after first few urination  -large blood clots that are difficult to pass  -urine stream diminishes or stops completely  -fever equal to or higher than 101 degrees Farenheit.  -cloudy urine with a strong, foul odor  -severe pain  Females should always wipe from front to back after elimination.  You may feel some burning pain when you urinate.  This should disappear with time.  Applying moist heat  to the lower abdomen or a hot tub bath may help relieve the pain. \  Follow-Up / Date of Return Visit to Your Physician:  *** Call for an appointment to arrange follow-up.  Patient Signature:  ________________________________________________________  Nurse's Signature:  ________________________________________________________

## 2012-03-09 NOTE — Transfer of Care (Signed)
Immediate Anesthesia Transfer of Care Note  Patient: Dat Derksen  Procedure(s) Performed: Procedure(s) (LRB): CYSTOSCOPY WITH RETROGRADE PYELOGRAM, URETEROSCOPY AND STENT PLACEMENT ()  Patient Location: Patient transported to PACU with oxygen via face mask at 4 Liters / Min  Anesthesia Type: General  Level of Consciousness: awake and alert   Airway & Oxygen Therapy: Patient Spontanous Breathing and Patient connected to face mask oxygen  Post-op Assessment: Report given to PACU RN and Post -op Vital signs reviewed and stable  Post vital signs: Reviewed and stable  Dentition: Teeth and oropharynx remain in pre-op condition  Complications: No apparent anesthesia complications

## 2012-03-09 NOTE — H&P (View-Only) (Signed)
History of Present Illness           New patient seen in consult for a 4 mm left ureteral stone referred by Charlestine Night PA from [facility] ER. Pt developed acute onset of left flank pain about two days ago. He was seen at [facility] emergency department in [place] while at a job and was diagnosed with a 4 mm left ureteral stone. This was reviewed from his official ER discharge paperwork. This is his fisrt stone. Pain is severe and intermittent. He started on tamsulosin and pain medicines. He's been having emesis today, has uncontrolled pain and been to the ER twice. He got toradol earlier.   He has no dysuria and AUASS = 0.   A kub was done Feb 20, 2012 which revealed no acute abnormality. Bowel gas obscures the renal shadows. No large calculi identified.  There are no other associated signs or symptoms. There are no other aggravating or alleviating factors.   Past Medical History Problems  1. History of  Heartburn 787.1 2. History of  Peptic Ulcer V12.71  Surgical History Problems  1. History of  Chest Tube Insertion 2. History of  Rectal Surgery  Current Meds 1. Flomax 0.4 MG Oral Capsule; Therapy: (Recorded:31May2013) to 2. Percocet TABS; Therapy: (Recorded:31May2013) to 3. Zofran 4 MG Oral Tablet; Therapy: (Recorded:31May2013) to  Allergies Medication  1. No Known Drug Allergies  Family History Problems  1. Family history of  Family Health Status - Father's Age 82 2. Family history of  Family Health Status - Mother's Age 68 3. Family history of  Family Health Status Number Of Children 2 daughters 4. Paternal history of  Prostate Cancer V16.42 mgf  Social History Problems    Caffeine Use 1   Marital History - Currently Married   Never A Smoker Denied    History of  Alcohol Use  Review of Systems Genitourinary, constitutional, skin, eye, otolaryngeal, hematologic/lymphatic, cardiovascular, pulmonary, endocrine,  musculoskeletal, gastrointestinal, neurological and psychiatric system(s) were reviewed and pertinent findings if present are noted.  Gastrointestinal: nausea, vomiting and constipation.  Neurological: headache.    Vitals Vital Signs [Data Includes: Last 1 Day]  31May2013 02:04PM  BMI Calculated: 23.85 BSA Calculated: 1.88 Height: 5 ft 9 in Weight: 161 lb  Blood Pressure: 122 / 71 Temperature: 97.8 F Heart Rate: 71 Respiration: 18  Physical Exam Constitutional: Well nourished and well developed . No acute distress. Rocking around and looks in pain.  ENT:. The ears and nose are normal in appearance.  Neck: The appearance of the neck is normal and no neck mass is present.  Pulmonary: No respiratory distress and normal respiratory rhythm and effort.  Cardiovascular: Heart rate and rhythm are normal . No peripheral edema.  Abdomen: The abdomen is soft and nontender. No masses are palpated. No CVA tenderness. No hernias are palpable. No hepatosplenomegaly noted.  Lymphatics: The femoral and inguinal nodes are not enlarged or tender.  Skin: Normal skin turgor, no visible rash and no visible skin lesions.  Neuro/Psych:. Mood and affect are appropriate.    Results/Data Urine [Data Includes: Last 1 Day]   31May2013  COLOR AMBER   APPEARANCE CLEAR   SPECIFIC GRAVITY 1.025   pH 5.5   GLUCOSE NEG mg/dL  BILIRUBIN NEG   KETONE 15 mg/dL  BLOOD LARGE   PROTEIN TRACE mg/dL  UROBILINOGEN 0.2 mg/dL  NITRITE NEG   LEUKOCYTE ESTERASE NEG   SQUAMOUS EPITHELIAL/HPF NONE SEEN   WBC 0-3  WBC/hpf  RBC 11-20 RBC/hpf  BACTERIA NONE SEEN   CRYSTALS Calcium Oxalate crystals noted   CASTS NONE SEEN    Old records or history reviewed: 3 pages + epic chart.    Discussion/Summary     Using the understanding kidney stone handout we discussed the nature , R/B of continued stone passage with MET, cysto/left RGP/ureteral stent poss URS/laserlitho, or ESWL. All questions answered. He elects to proceed  with cysto/left RGP/ureteral stent poss URS/laserlitho. We also discussed the side effects of the proposed treatment, the likelihood of the patient achieving the goals of the procedure, and any potential problems that might occur during the procedure or recuperation. We discussed the need for a staged procedure with stent placement followed by stone treatment in the future.

## 2012-03-10 NOTE — Op Note (Signed)
Joe Higgins, Joe Higgins NO.:  [id number]  MEDICAL RECORD NO.:  [id number]  LOCATION:  [facility]                         FACILITY:  [facility]  PHYSICIAN:  Jerilee Field, MD   DATE OF BIRTH:  08/25/1961  DATE OF PROCEDURE:  03/09/2012 DATE OF DISCHARGE:                              OPERATIVE REPORT   PREOPERATIVE DIAGNOSIS:  Left ureteral stone.  POSTOPERATIVE DIAGNOSES:  Left ureteral stone and left renal stone.  SURGEON:  Jerilee Field, MD.  PROCEDURE:  Cystoscopy with left ureteroscopy, stone basket extraction, and left ureteral stent placement.  ANESTHESIA:  General.  DESCRIPTION OF PROCEDURE:  After consent was obtained, the patient was brought to the operating room.  A time-out was performed to confirm the patient and procedure.  After adequate anesthesia, he was placed in lithotomy position.  External genitalia was prepped and draped in the usual sterile fashion.  A 22-French cystoscope was then passed per urethra into the bladder.  The left ureteral stent was visualized. Under scout imaging, no obvious calcifications were seen.  The stent was grasped and pulled through the meatus where a sensor wire was then passed through the stent and coiled up into the left kidney.  The stent was then removed, the bladder had been drained.  A rigid ureteroscope was then advanced adjacent to the wire up to the level of the junction of the mid and proximal ureter just above the iliac.  No stone was seen. A second wire was passed and coiled in the kidney and the rigid scope removed.  Ureteral access sheath was passed to this level without difficulty.  Then, the digital ureteroscope was advanced up into the mid and proximal ureter at the original stone site as noted on the original retrograde.  There was some edema here and a clot, and a small stone fragment which was removed with a [employer].  This first piece of stone was a bit smaller than what I expected.   Therefore, the ureteroscope was passed again.  The ureter inspected and noted to be stone-free.  Then a systematic inspection of the collecting system starting at the upper, middle, and lower poles was performed.  The expected stone or remainder of the stone was found in the left lower pole and it was grasped with a ZeroTip basket and withdrawn without difficulty.  The stones were then collected and will be sent for analysis.  The ureteroscope was then advanced back up into the kidney. The upper, middle, and lower pole collecting system was carefully inspected again and noted to be stone-free.  The renal pelvis was stone- free.  The ureteral access sheath was backed down on the ureteroscope. The ureter was carefully inspected on the way out in its entirety and noted to be without injury and without stone.  There was some erythema and edema at the stone impaction site in the proximal ureter, but otherwise the ureter was normal.  The wire was then back-loaded on the cystoscope and a 6 x 24 cm stent was placed on the left side.  The wire was removed.  A good coil was seen in the kidney and a good coil in the bladder.  I broke the scope and drained the bladder and then carefully removed the sheath to leave the string in place, exiting through the meatus.  The patient was then awakened, taken to recovery room in stable condition.  COMPLICATIONS:  None.  DRAINS:  A 6 x 24 cm left ureteral stent.  SPECIMEN:  Left ureter and kidney stone to office lab.  DISPOSITION:  Stable to PACU.          ______________________________ Jerilee Field, MD     ME/MEDQ  D:  03/09/2012  T:  03/10/2012  Job:  [id number]

## 2013-08-25 ENCOUNTER — Encounter (HOSPITAL_COMMUNITY): Payer: Self-pay | Admitting: Emergency Medicine

## 2013-08-25 ENCOUNTER — Emergency Department (HOSPITAL_COMMUNITY): Payer: BC Managed Care – PPO

## 2013-08-25 ENCOUNTER — Emergency Department (HOSPITAL_COMMUNITY)
Admission: EM | Admit: 2013-08-25 | Discharge: 2013-08-25 | Disposition: A | Discharge status: Home / Self Care | Payer: BC Managed Care – PPO | Attending: Internal Medicine | Admitting: Internal Medicine

## 2013-08-25 ENCOUNTER — Encounter (HOSPITAL_COMMUNITY)
Admission: EM | Disposition: A | Discharge status: Home / Self Care | Payer: Self-pay | Source: Home / Self Care | Attending: Emergency Medicine

## 2013-08-25 ENCOUNTER — Telehealth: Payer: Self-pay

## 2013-08-25 DIAGNOSIS — Z8719 Personal history of other diseases of the digestive system: Secondary | ICD-10-CM | POA: Insufficient documentation

## 2013-08-25 DIAGNOSIS — IMO0002 Reserved for concepts with insufficient information to code with codable children: Secondary | ICD-10-CM

## 2013-08-25 DIAGNOSIS — Z79899 Other long term (current) drug therapy: Secondary | ICD-10-CM | POA: Insufficient documentation

## 2013-08-25 DIAGNOSIS — T18108A Unspecified foreign body in esophagus causing other injury, initial encounter: Secondary | ICD-10-CM

## 2013-08-25 DIAGNOSIS — Y939 Activity, unspecified: Secondary | ICD-10-CM | POA: Insufficient documentation

## 2013-08-25 DIAGNOSIS — Z87442 Personal history of urinary calculi: Secondary | ICD-10-CM | POA: Insufficient documentation

## 2013-08-25 DIAGNOSIS — Y929 Unspecified place or not applicable: Secondary | ICD-10-CM | POA: Insufficient documentation

## 2013-08-25 DIAGNOSIS — R933 Abnormal findings on diagnostic imaging of other parts of digestive tract: Secondary | ICD-10-CM

## 2013-08-25 HISTORY — DX: Personal history of other diseases of the respiratory system: Z87.09

## 2013-08-25 HISTORY — PX: ESOPHAGOGASTRODUODENOSCOPY: SHX5428

## 2013-08-25 LAB — BASIC METABOLIC PANEL
BUN: 14 mg/dL (ref 6–23)
CO2: 27 mEq/L (ref 19–32)
Calcium: 9.4 mg/dL (ref 8.4–10.5)
Chloride: 102 mEq/L (ref 96–112)
Creatinine, Ser: 0.98 mg/dL (ref 0.50–1.35)
Glucose, Bld: 102 mg/dL — ABNORMAL HIGH (ref 70–99)

## 2013-08-25 LAB — CBC WITH DIFFERENTIAL/PLATELET
Basophils Absolute: 0 10*3/uL (ref 0.0–0.1)
Eosinophils Relative: 2 % (ref 0–5)
HCT: 46 % (ref 39.0–52.0)
Hemoglobin: 16 g/dL (ref 13.0–17.0)
Lymphocytes Relative: 9 % — ABNORMAL LOW (ref 12–46)
Lymphs Abs: 1 10*3/uL (ref 0.7–4.0)
MCV: 89 fL (ref 78.0–100.0)
Monocytes Absolute: 0.9 10*3/uL (ref 0.1–1.0)
Monocytes Relative: 8 % (ref 3–12)
Neutro Abs: 9.3 10*3/uL — ABNORMAL HIGH (ref 1.7–7.7)
RBC: 5.17 MIL/uL (ref 4.22–5.81)
WBC: 11.4 10*3/uL — ABNORMAL HIGH (ref 4.0–10.5)

## 2013-08-25 SURGERY — EGD (ESOPHAGOGASTRODUODENOSCOPY)
Anesthesia: Moderate Sedation

## 2013-08-25 MED ORDER — ONDANSETRON HCL 4 MG/2ML IJ SOLN
INTRAMUSCULAR | Status: DC | PRN
  Administered 2013-08-25: 4 mg via INTRAVENOUS

## 2013-08-25 MED ORDER — MIDAZOLAM HCL 5 MG/5ML IJ SOLN
INTRAMUSCULAR | Status: AC
  Filled 2013-08-25: qty 10

## 2013-08-25 MED ORDER — DEXLANSOPRAZOLE 60 MG PO CPDR: 60.0000 mg | DELAYED_RELEASE_CAPSULE | Freq: Every day | ORAL | Status: DC

## 2013-08-25 MED ORDER — MEPERIDINE HCL 100 MG/ML IJ SOLN
INTRAMUSCULAR | Status: DC | PRN
  Administered 2013-08-25 (×3): 25 mg via INTRAVENOUS
  Administered 2013-08-25: 50 mg via INTRAVENOUS

## 2013-08-25 MED ORDER — ONDANSETRON HCL 4 MG/2ML IJ SOLN
INTRAMUSCULAR | Status: AC
  Filled 2013-08-25: qty 2

## 2013-08-25 MED ORDER — STERILE WATER FOR IRRIGATION IR SOLN
Status: DC | PRN
  Administered 2013-08-25: 11:00:00

## 2013-08-25 MED ORDER — MEPERIDINE HCL 100 MG/ML IJ SOLN
INTRAMUSCULAR | Status: AC
  Filled 2013-08-25: qty 2

## 2013-08-25 MED ORDER — SODIUM CHLORIDE 0.9 % IV SOLN
INTRAVENOUS | Status: DC
  Administered 2013-08-25: 07:00:00 via INTRAVENOUS

## 2013-08-25 MED ORDER — MIDAZOLAM HCL 5 MG/5ML IJ SOLN
INTRAMUSCULAR | Status: DC | PRN
  Administered 2013-08-25 (×4): 1 mg via INTRAVENOUS
  Administered 2013-08-25 (×2): 2 mg via INTRAVENOUS

## 2013-08-25 MED ORDER — SODIUM CHLORIDE 0.9 % IV SOLN
INTRAVENOUS | Status: DC
  Administered 2013-08-25: 10:00:00 via INTRAVENOUS

## 2013-08-25 MED ORDER — BUTAMBEN-TETRACAINE-BENZOCAINE 2-2-14 % EX AERO
INHALATION_SPRAY | CUTANEOUS | Status: DC | PRN
  Administered 2013-08-25: 2 via TOPICAL

## 2013-08-25 NOTE — ED Notes (Signed)
Pt reports difficulty swallowing some of his dinner last night. States he thought it would get better but it still feels as though it is stuck. States he has difficulty swallowing for over a year but this is the first time it has gotten stuck.

## 2013-08-25 NOTE — ED Notes (Signed)
Joe Higgins with [facility] called and will come for pt at 10:15 for EED

## 2013-08-25 NOTE — Telephone Encounter (Signed)
RMR called- pt was seen urgently in endo for a food impaction. Pt needs 2 weeks of dexilant samples and an rx sent to the pharmacy for #30 with 11 refills. Samples at the front desk. When pt comes in SS is aware to find out which pharmacy he uses and let me know.  Pt also needs ov next week with extender. Darl Pikes put pt on the schedule for next Thursday, 09/01/13 with LSL. Per RMR pt will need to be scheduled for a tcs/egd after the first of the year because he did bx today. Ok to keep him on the schedule for next week for ov.

## 2013-08-25 NOTE — ED Notes (Signed)
MD at bedside. 

## 2013-08-25 NOTE — H&P (Signed)
Primary Care Physician:  No PCP Per Patient Primary Gastroenterologist:  Dr. Jena Gauss  Pre-Procedure History & Physical: HPI:  Joe Higgins is a 52 y.o. male here for probable food impaction. Came to the ED overnight after trying to swallow a tough piece of meat for supper last evening. Felt it stick. Has not been able to swallow anything including his saliva since that time. Seen by Dr. Lynelle Doctor in the ED. Basic labs and chest x-ray negative. He gives a history of intermittent esophageal dysphagia solids over the past year. He also gives a history of long, long-standing reflux symptoms has been on Prevacid off and on in the past. No PCP currently.  Previously saw Birdena Jubilee and [address].  No prior upper endoscopic evaluation. As a separate issue, he is due for her first ever average risk colorectal cancer screening examination.  Past Medical History  Diagnosis Date  . Nephrolithiasis LEFT  . Hematuria   . Dysuria   . Mild acid reflux WATCHES DIET  . History of pneumothorax     Past Surgical History  Procedure Laterality Date  . Anal fissure repair  08-11-2006  . Cysto/ retrograde pyelogram/ left ureteroscopy/ left ureteral stent placement  02-20-2012    Prior to Admission medications   Medication Sig Start Date End Date Taking? Authorizing Provider  Multiple Vitamins-Minerals (MULTIVITAMIN WITH MINERALS) tablet Take 1 tablet by mouth daily.   Yes Historical Provider, MD    Allergies as of 08/25/2013  . (No Known Allergies)    Family History  Problem Relation Age of Onset  . Colon cancer Neg Hx     History   Social History  . Marital Status: Single    Spouse Name: N/A    Number of Children: N/A  . Years of Education: N/A   Occupational History  . Not on file.   Social History Main Topics  . Smoking status: Never Smoker   . Smokeless tobacco: Never Used  . Alcohol Use: No  . Drug Use: No  . Sexual Activity: Not on file   Other Topics Concern  . Not on file   Social  History Narrative  . No narrative on file    Review of Systems: See HPI, otherwise negative ROS  Physical Exam: BP 124/79  Pulse 73  Temp(Src) 98.2 F (36.8 C) (Oral)  Resp 20  Ht 5\' 9"  (1.753 m)  Wt 165 lb (74.844 kg)  BMI 24.36 kg/m2  SpO2 99% General:   Alert,  Well-developed, well-nourished, pleasant and cooperative in NAD Skin:  Intact without significant lesions or rashes. Eyes:  Sclera clear, no icterus.   Conjunctiva pink. Ears:  Normal auditory acuity. Nose:  No deformity, discharge,  or lesions. Mouth:  No deformity or lesions. Neck:  Supple; no masses or thyromegaly. No significant cervical adenopathy. Lungs:  Clear throughout to auscultation.   No wheezes, crackles, or rhonchi. No acute distress. Heart:  Regular rate and rhythm; no murmurs, clicks, rubs,  or gallops. Abdomen: Non-distended, normal bowel sounds.  Soft and nontender without appreciable mass or hepatosplenomegaly.  Pulses:  Normal pulses noted. Extremities:  Without clubbing or edema.  Impression:   Pleasant 52 year old gentleman with long-standing GERD intermittent esophageal dysphagia solids now with what sounds like a food impaction. Patient likely has a ring and/or a stricture. Eosinophilic esophagitis not ruled out at this time.  Recommendations:   Urgent EGD for disimpact today. I told the patient I would likely not be able to offer him a dilation today. If  he needs a dilation, will have him return in the next couple of weeks electively for that procedure. As a separate issue, discussed colorectal cancer screening. Patient is desirous of having a colonoscopy the same time as an EGD. This can be worked out electively in the near future.The risks, benefits, limitations, alternatives and imponderables have been reviewed with the patient. Potential for esophageal dilation, biopsy, etc. have also been reviewed.  Questions have been answered. All parties agreeable.     The risks, benefits, limitations,  alternatives and imponderables have been reviewed with the patient. Potential for esophageal dilation, biopsy, etc. have also been reviewed.  Questions have been answered. All parties agreeable.

## 2013-08-25 NOTE — ED Notes (Signed)
Pt c/o " feels like something is stuck in my throat. I ate some steak last night that was tough and I feel like it is stuck" pt sitting up right in bed at this time.pt speaking complete sentences. sats maintained at 100% on RA. Family at bsd.

## 2013-08-25 NOTE — Op Note (Signed)
[facility] [address] [place], [address]   ENDOSCOPY PROCEDURE REPORT  PATIENT: Joe, Higgins  MR#: [id number] BIRTHDATE: 1961-07-05 , 52  yrs. old GENDER: Male ENDOSCOPIST: R.  Roetta Sessions, MD FACP FACG REFERRED BY:  Devoria Albe, M.D. PROCEDURE DATE:  08/25/2013 PROCEDURE:     Emergency EGD with removal of esophageal meat impaction and esophageal biopsy  INDICATIONS:     Meat bolus impaction  INFORMED CONSENT:   The risks, benefits, limitations, alternatives and imponderables have been discussed.  The potential for biopsy, esophogeal dilation, etc. have also been reviewed.  Questions have been answered.  All parties agreeable.  Please see the history and physical in the medical record for more information.  MEDICATIONS:  Versed 8 mg IV and Demerol 125 mg IV in divided doses. Zofran 4 mg IV. Cetacaine spray.  DESCRIPTION OF PROCEDURE:   The EG-[id number] ([id number])  endoscope was introduced through the mouth and advanced to the second portion of the duodenum without difficulty or limitations.  The mucosal surfaces were surveyed very carefully during advancement of the scope and upon withdrawal.  Retroflexion view of the proximal stomach and esophagogastric junction was performed.      FINDINGS: large amount of meat impacted in the distal esophagus. Mid and proximal esophagus had a ringed appearance with longitudinal furrows suspicious for eosinophilic esophagitis. Applying gentle pressure against the proximal end of the meat bolus. I was not successful in pushing any of the meat into the stomach. Subsequently, I begin removing the bolus piecemeal with a rothe net p and the tripod. Ultimately I broke up the meat bolus enough where the remaining meat fell into the stomach. The patient had a Schatzki's ring but also had significant ringing of the entire esophagus. The esophagus had a feline appearance and was quite friable. There was food in the stomach. No  attempt at completing an EGD was made today.  The esophagus was not dilated. However, multiple biopsies the mid and distal esophagus were taken for histologic study  THERAPEUTIC / DIAGNOSTIC MANEUVERS PERFORMED:  see above   COMPLICATIONS:  None  IMPRESSION:   Esophageal food impaction-removed as described above. Abnormal esophagus suspicious for eosinophilic esophagitis-status post biopsy  RECOMMENDATIONS:     Soft diet/swallowing precautions reviewed. Begin Dexilant 60 mg daily. Patient is to go by my office for free samples. Followup on pathology. Will schedule return EGD with esophageal dilation, etc. as appropriate in the next 3 weeks. At that time, we'll also plan to perform his first ever screening colonoscopy at patient request.    _______________________________ R. Roetta Sessions, MD FACP [facility] eSigned:  R. Roetta Sessions, MD FACP [facility] 08/25/2013 12:10 PM     CC:  PATIENT NAME:  Joe, Higgins MR#: [id number]

## 2013-08-25 NOTE — Telephone Encounter (Signed)
Pt uses [facility] in [place]. Rx has been sent.

## 2013-08-25 NOTE — ED Provider Notes (Addendum)
CSN: [id number]     Arrival date & time 08/25/13  0603 History   First MD Initiated Contact with Patient 08/25/13 [phone]     Chief Complaint  Patient presents with  . Foreign Body   (Consider location/radiation/quality/duration/timing/severity/associated sxs/prior Treatment) HPI Patient reports for the past year he's been having difficulty swallowing foods. He states breads and meats are the worse. He states he has to drink a lot of water to help his food pass. He indicates he feels like the food gets stuck high and on the left side. He states 8 PM last night he was eating a steak that was a little bit tough that his daughter had cooked. He states he felt like the food got stuck. He states he feels like it is still stuck and indicates behind his upper chest area. He states he has been trying to drink water but it immediately comes back up. He's been trying to induce vomiting without success. He states he's had a mild cough for couple days without fever. He denies any shortness of breath.  Pt states he thinks he saw a GI at [facility] years ago ago for GERD but does not recall ever having an EDG or Colonoscopy in the past.    [facility] in [place]   Past Medical History  Diagnosis Date  . Nephrolithiasis LEFT  . Hematuria   . Dysuria   . Mild acid reflux WATCHES DIET   Past Surgical History  Procedure Laterality Date  . Anal fissure repair  08-11-2006  . Cysto/ retrograde pyelogram/ left ureteroscopy/ left ureteral stent placement  02-20-2012   No family history on file. History  Substance Use Topics  . Smoking status: Never Smoker   . Smokeless tobacco: Never Used  . Alcohol Use: No  lives at home Lives with spouse employed  Review of Systems  All other systems reviewed and are negative.    Allergies  Review of patient's allergies indicates no known allergies.  Home Medications   Current Outpatient Rx  Name  Route  Sig  Dispense  Refill  . Multiple  Vitamins-Minerals (MULTIVITAMIN WITH MINERALS) tablet   Oral   Take 1 tablet by mouth daily.         Marland Kitchen HYDROcodone-acetaminophen (NORCO) 5-325 MG per tablet   Oral   Take 1 tablet by mouth every 6 (six) hours as needed.         . ondansetron (ZOFRAN) 4 MG tablet   Oral   Take 4 mg by mouth every 6 (six) hours as needed. nausea         . oxyCODONE-acetaminophen (PERCOCET) 5-325 MG per tablet   Oral   Take 1 tablet by mouth every 4 (four) hours as needed. pain         . Tamsulosin HCl (FLOMAX) 0.4 MG CAPS   Oral   Take 0.4 mg by mouth daily.          BP 140/81  Pulse 82  Temp(Src) 98.3 F (36.8 C) (Oral)  Resp 18  Ht 5\' 9"  (1.753 m)  Wt 165 lb (74.844 kg)  BMI 24.36 kg/m2  SpO2 100%  Vital signs normal   Physical Exam  Nursing note and vitals reviewed. Constitutional: He is oriented to person, place, and time. He appears well-developed and well-nourished.  Non-toxic appearance. He does not appear ill. No distress.  HENT:  Head: Normocephalic and atraumatic.  Right Ear: External ear normal.  Left Ear: External ear normal.  Nose: Nose  normal. No mucosal edema or rhinorrhea.  Mouth/Throat: Oropharynx is clear and moist and mucous membranes are normal. No dental abscesses or uvula swelling.  Eyes: Conjunctivae and EOM are normal. Pupils are equal, round, and reactive to light.  Neck: Normal range of motion and full passive range of motion without pain. Neck supple.  Cardiovascular: Normal rate, regular rhythm and normal heart sounds.  Exam reveals no gallop and no friction rub.   No murmur heard. Pulmonary/Chest: Effort normal and breath sounds normal. No respiratory distress. He has no wheezes. He has no rhonchi. He has no rales. He exhibits no tenderness and no crepitus.  Abdominal: Soft. Normal appearance and bowel sounds are normal. He exhibits no distension. There is no tenderness. There is no rebound and no guarding.  Musculoskeletal: Normal range of motion.  He exhibits no edema and no tenderness.  Moves all extremities well.   Neurological: He is alert and oriented to person, place, and time. He has normal strength. No cranial nerve deficit.  Skin: Skin is warm, dry and intact. No rash noted. No erythema. No pallor.  Psychiatric: He has a normal mood and affect. His speech is normal and behavior is normal. His mood appears not anxious.    ED Course  Procedures (including critical care time)  Medications  0.9 %  sodium chloride infusion ( Intravenous New Bag/Given 08/25/13 1610)    07:28 waiting for GI to call back.   07:32 Dr Jena Gauss will send his PA to see the patient.    Labs Review  Results for orders placed during the hospital encounter of 08/25/13  CBC WITH DIFFERENTIAL      Result Value Range   WBC 11.4 (*) 4.0 - 10.5 K/uL   RBC 5.17  4.22 - 5.81 MIL/uL   Hemoglobin 16.0  13.0 - 17.0 g/dL   HCT 96.0  45.4 - 09.8 %   MCV 89.0  78.0 - 100.0 fL   MCH 30.9  26.0 - 34.0 pg   MCHC 34.8  30.0 - 36.0 g/dL   RDW 11.9  14.7 - 82.9 %   Platelets 296  150 - 400 K/uL   Neutrophils Relative % 81 (*) 43 - 77 %   Neutro Abs 9.3 (*) 1.7 - 7.7 K/uL   Lymphocytes Relative 9 (*) 12 - 46 %   Lymphs Abs 1.0  0.7 - 4.0 K/uL   Monocytes Relative 8  3 - 12 %   Monocytes Absolute 0.9  0.1 - 1.0 K/uL   Eosinophils Relative 2  0 - 5 %   Eosinophils Absolute 0.2  0.0 - 0.7 K/uL   Basophils Relative 0  0 - 1 %   Basophils Absolute 0.0  0.0 - 0.1 K/uL  BASIC METABOLIC PANEL      Result Value Range   Sodium 139  135 - 145 mEq/L   Potassium 4.0  3.5 - 5.1 mEq/L   Chloride 102  96 - 112 mEq/L   CO2 27  19 - 32 mEq/L   Glucose, Bld 102 (*) 70 - 99 mg/dL   BUN 14  6 - 23 mg/dL   Creatinine, Ser 5.62  0.50 - 1.35 mg/dL   Calcium 9.4  8.4 - 13.0 mg/dL   GFR calc non Af Amer >90  >90 mL/min   GFR calc Af Amer >90  >90 mL/min   Laboratory interpretation all normal except leukocytosis   Imaging Review Dg Chest 2 View  08/25/2013   CLINICAL DATA:  Cough  EXAM: CHEST  2 VIEW  COMPARISON:  None.  FINDINGS: Heart size and vascularity are normal. Lungs are clear without infiltrate effusion or mass. Chronic healed rib fractures on the right.  Denies and history of reflux years ago and has been having trouble swallowing solids and he by his daughter and he of he is following his no respiratory distress and had a mild cough chest x-rays and a labs the wife wanted Dr. Darrick Penna will Lequita Halt is in her he received a GI-and for a Lahey all and In his mouth he is to call me backIMPRESSION: No acute abnormality.   Electronically Signed   By: Marlan Palau M.D.   On: 08/25/2013 07:14    EKG Interpretation   None       MDM   1. Esophageal foreign body, initial encounter     Disposition pending   Devoria Albe, MD, Franz Dell, MD 08/25/13 1610  Ward Givens, MD 08/25/13 [phone]

## 2013-08-29 ENCOUNTER — Encounter: Payer: Self-pay | Admitting: Internal Medicine

## 2013-09-01 ENCOUNTER — Ambulatory Visit: Payer: BC Managed Care – PPO | Admitting: Gastroenterology

## 2013-09-06 ENCOUNTER — Encounter (HOSPITAL_COMMUNITY): Payer: Self-pay | Admitting: Internal Medicine

## 2013-10-03 ENCOUNTER — Ambulatory Visit: Payer: BC Managed Care – PPO | Admitting: Gastroenterology

## 2013-10-26 ENCOUNTER — Other Ambulatory Visit: Payer: Self-pay | Admitting: Internal Medicine

## 2013-10-26 ENCOUNTER — Ambulatory Visit (INDEPENDENT_AMBULATORY_CARE_PROVIDER_SITE_OTHER): Payer: BC Managed Care – PPO | Admitting: Gastroenterology

## 2013-10-26 ENCOUNTER — Encounter: Payer: Self-pay | Admitting: Gastroenterology

## 2013-10-26 ENCOUNTER — Encounter (INDEPENDENT_AMBULATORY_CARE_PROVIDER_SITE_OTHER): Payer: Self-pay

## 2013-10-26 VITALS — BP 126/74 | HR 64 | Temp 97.4°F | Wt 156.6 lb

## 2013-10-26 DIAGNOSIS — Z1211 Encounter for screening for malignant neoplasm of colon: Secondary | ICD-10-CM

## 2013-10-26 DIAGNOSIS — R1319 Other dysphagia: Secondary | ICD-10-CM

## 2013-10-26 DIAGNOSIS — R1310 Dysphagia, unspecified: Secondary | ICD-10-CM

## 2013-10-26 MED ORDER — PEG 3350-KCL-NA BICARB-NACL 420 G PO SOLR: 4000.0000 mL | ORAL | Status: DC

## 2013-10-26 NOTE — Progress Notes (Signed)
 Primary Care Physician:  Used to be Donna Steadman, NP (no current PCP) Primary GI: Dr. Rourk   Chief Complaint  Patient presents with  . Follow-up    HPI:   Joe Higgins presents today to schedule repeat EGD with dilation and initial screening colonoscopy. Was seen emergently in Dec 2014 secondary to food impaction; underwent EGD for food removal. Esophagitis noted. Notes chronic hx of dysphagia, at least 2-3 years. Sometimes feels worse than others. Difficult with solid foods, tough textures, breads. When flares even has issues with liquids. No pill dysphagia. Hx of GERD. Fiancee said did much better with Dexilant daily. Ran out recently. Has dry cough in the morning. No abdominal pain. No N/V. No constipation, diarrhea. No rectal bleeding.    Past Medical History  Diagnosis Date  . Nephrolithiasis LEFT  . Hematuria   . Dysuria   . Mild acid reflux WATCHES DIET  . History of pneumothorax     Past Surgical History  Procedure Laterality Date  . Anal fissure repair  08-11-2006  . Cysto/ retrograde pyelogram/ left ureteroscopy/ left ureteral stent placement  02-20-2012  . Esophagogastroduodenoscopy N/A 08/25/2013    RMR:Esophageal food impaction, s/p removal. esophagitis on path    Current Outpatient Prescriptions  Medication Sig Dispense Refill  . Multiple Vitamins-Minerals (MULTIVITAMIN WITH MINERALS) tablet Take 1 tablet by mouth daily.      . dexlansoprazole (DEXILANT) 60 MG capsule Take 1 capsule (60 mg total) by mouth daily.  30 capsule  11   No current facility-administered medications for this visit.    Allergies as of 10/26/2013  . (No Known Allergies)    Family History  Problem Relation Age of Onset  . Colon cancer Neg Hx     History   Social History  . Marital Status: Single    Spouse Name: N/A    Number of Children: N/A  . Years of Education: N/A   Occupational History  . self-employed     back hoe   Social History Main Topics  . Smoking status:  Never Smoker   . Smokeless tobacco: Never Used  . Alcohol Use: No  . Drug Use: No  . Sexual Activity: None   Other Topics Concern  . None   Social History Narrative  . None    Review of Systems: Gen: Denies fever, chills, anorexia. Denies fatigue, weakness, weight loss.  CV: Denies chest pain, palpitations, syncope, peripheral edema, and claudication. Resp: Denies dyspnea at rest, cough, wheezing, coughing up blood, and pleurisy. GI: see HPI Derm: Denies rash, itching, dry skin Psych: Denies depression, anxiety, memory loss, confusion. No homicidal or suicidal ideation.  Heme: Denies bruising, bleeding, and enlarged lymph nodes.  Physical Exam: BP 126/74  Pulse 64  Temp(Src) 97.4 F (36.3 C) (Oral)  Wt 156 lb 9.6 oz (71.033 kg) General:   Alert and oriented. No distress noted. Pleasant and cooperative.  Head:  Normocephalic and atraumatic. Eyes:  Conjuctiva clear without scleral icterus. Mouth:  Oral mucosa pink and moist. Good dentition. No lesions. Neck:  Supple, without mass or thyromegaly. Heart:  S1, S2 present without murmurs, rubs, or gallops. Regular rate and rhythm. Abdomen:  +BS, soft, non-tender and non-distended. No rebound or guarding. No HSM or masses noted. Msk:  Symmetrical without gross deformities. Normal posture. Pulses:  2+ DP noted bilaterally Extremities:  Without edema. Neurologic:  Alert and  oriented x4;  grossly normal neurologically. Skin:  Intact without significant lesions or rashes. Cervical Nodes:  No significant   cervical adenopathy. Psych:  Alert and cooperative. Normal mood and affect.

## 2013-10-26 NOTE — Patient Instructions (Signed)
Continue to take Dexilant each morning.   We have scheduled you for a routine colonoscopy with upper endoscopy and dilation with Dr. Gala Romney.  Further recommendations to follow!

## 2013-10-27 DIAGNOSIS — Z1211 Encounter for screening for malignant neoplasm of colon: Secondary | ICD-10-CM | POA: Insufficient documentation

## 2013-10-27 DIAGNOSIS — R1310 Dysphagia, unspecified: Secondary | ICD-10-CM | POA: Insufficient documentation

## 2013-10-27 NOTE — Assessment & Plan Note (Signed)
No lower GI symptoms. Will proceed with initial screening colonoscopy at time of EGD.   Proceed with TCS with Dr. Gala Romney in near future: the risks, benefits, and alternatives have been discussed with the patient in detail. The patient states understanding and desires to proceed.

## 2013-10-27 NOTE — Assessment & Plan Note (Signed)
53 year old male with recent food impaction requiring emergent EGD, now presenting for elective repeat EGD with dilation. Notes chronic dysphagia to solids and sometimes liquids in the presence of chronic GERD. Improvement in GERD symptoms with Dexilant.   Proceed with upper endoscopy and dilation in the near future with Dr. Gala Romney. The risks, benefits, and alternatives have been discussed in detail with patient. They have stated understanding and desire to proceed.  Dexilant daily

## 2013-10-31 NOTE — Progress Notes (Signed)
NO PCP

## 2013-11-07 ENCOUNTER — Encounter (HOSPITAL_COMMUNITY): Payer: Self-pay | Admitting: Pharmacy Technician

## 2013-11-14 ENCOUNTER — Telehealth: Payer: Self-pay

## 2013-11-14 NOTE — Telephone Encounter (Signed)
Yes. May call in Protonix 40 mg daily, disp #30 with 1 refill.

## 2013-11-14 NOTE — Telephone Encounter (Signed)
Pt is aware.  

## 2013-11-14 NOTE — Telephone Encounter (Signed)
Pt has need to try other medication before insurance will pay for Dexilant. He has tried Nexium and that did not work. Can we call in Protonix for him to try or Omeprazole.

## 2013-11-21 ENCOUNTER — Encounter (HOSPITAL_COMMUNITY): Payer: Self-pay | Admitting: *Deleted

## 2013-11-21 ENCOUNTER — Encounter (HOSPITAL_COMMUNITY)
Admission: RE | Disposition: A | Discharge status: Home Health | Payer: Self-pay | Source: Ambulatory Visit | Attending: Internal Medicine

## 2013-11-21 ENCOUNTER — Ambulatory Visit (HOSPITAL_COMMUNITY)
Admission: RE | Admit: 2013-11-21 | Discharge: 2013-11-21 | Disposition: A | Discharge status: Home Health | Payer: BC Managed Care – PPO | Source: Ambulatory Visit | Attending: Internal Medicine | Admitting: Internal Medicine

## 2013-11-21 DIAGNOSIS — Z1211 Encounter for screening for malignant neoplasm of colon: Secondary | ICD-10-CM

## 2013-11-21 DIAGNOSIS — R1310 Dysphagia, unspecified: Secondary | ICD-10-CM

## 2013-11-21 DIAGNOSIS — R1319 Other dysphagia: Secondary | ICD-10-CM

## 2013-11-21 DIAGNOSIS — K219 Gastro-esophageal reflux disease without esophagitis: Secondary | ICD-10-CM | POA: Insufficient documentation

## 2013-11-21 DIAGNOSIS — K200 Eosinophilic esophagitis: Secondary | ICD-10-CM | POA: Insufficient documentation

## 2013-11-21 DIAGNOSIS — D126 Benign neoplasm of colon, unspecified: Secondary | ICD-10-CM | POA: Insufficient documentation

## 2013-11-21 HISTORY — PX: COLONOSCOPY, ESOPHAGOGASTRODUODENOSCOPY (EGD) AND ESOPHAGEAL DILATION: SHX5781

## 2013-11-21 HISTORY — DX: Dysphagia, unspecified: R13.10

## 2013-11-21 SURGERY — COLONOSCOPY, ESOPHAGOGASTRODUODENOSCOPY (EGD) AND ESOPHAGEAL DILATION (ED)
Anesthesia: Moderate Sedation

## 2013-11-21 MED ORDER — ONDANSETRON HCL 4 MG/2ML IJ SOLN
INTRAMUSCULAR | Status: DC | PRN
  Administered 2013-11-21: 4 mg via INTRAVENOUS

## 2013-11-21 MED ORDER — LIDOCAINE VISCOUS 2 % MT SOLN
OROMUCOSAL | Status: DC | PRN
Start: 2013-11-21 — End: 2013-11-21
  Administered 2013-11-21: 4 mL via OROMUCOSAL

## 2013-11-21 MED ORDER — LIDOCAINE VISCOUS 2 % MT SOLN
OROMUCOSAL | Status: AC
  Filled 2013-11-21: qty 15

## 2013-11-21 MED ORDER — ONDANSETRON HCL 4 MG/2ML IJ SOLN
INTRAMUSCULAR | Status: AC
  Filled 2013-11-21: qty 2

## 2013-11-21 MED ORDER — MEPERIDINE HCL 100 MG/ML IJ SOLN
INTRAMUSCULAR | Status: DC | PRN
  Administered 2013-11-21: 50 mg via INTRAVENOUS
  Administered 2013-11-21: 25 mg via INTRAVENOUS
  Administered 2013-11-21: 50 mg via INTRAVENOUS

## 2013-11-21 MED ORDER — MIDAZOLAM HCL 5 MG/5ML IJ SOLN
INTRAMUSCULAR | Status: DC | PRN
  Administered 2013-11-21: 2 mg via INTRAVENOUS
  Administered 2013-11-21: 1 mg via INTRAVENOUS
  Administered 2013-11-21: 2 mg via INTRAVENOUS

## 2013-11-21 MED ORDER — SODIUM CHLORIDE 0.9 % IV SOLN
INTRAVENOUS | Status: DC
Start: 2013-11-21 — End: 2013-11-21
  Administered 2013-11-21: 07:00:00 via INTRAVENOUS

## 2013-11-21 MED ORDER — MEPERIDINE HCL 100 MG/ML IJ SOLN
INTRAMUSCULAR | Status: AC
  Filled 2013-11-21: qty 2

## 2013-11-21 MED ORDER — STERILE WATER FOR IRRIGATION IR SOLN
Status: DC | PRN
  Administered 2013-11-21: 07:00:00

## 2013-11-21 MED ORDER — MIDAZOLAM HCL 5 MG/5ML IJ SOLN
INTRAMUSCULAR | Status: AC
  Filled 2013-11-21: qty 10

## 2013-11-21 NOTE — Discharge Instructions (Addendum)
Colonoscopy Discharge Instructions  Read the instructions outlined below and refer to this sheet in the next few weeks. These discharge instructions provide you with general information on caring for yourself after you leave the hospital. Your doctor may also give you specific instructions. While your treatment has been planned according to the most current medical practices available, unavoidable complications occasionally occur. If you have any problems or questions after discharge, call Dr. Gala Romney at [phone]. ACTIVITY  You may resume your regular activity, but move at a slower pace for the next 24 hours.   Take frequent rest periods for the next 24 hours.   Walking will help get rid of the air and reduce the bloated feeling in your belly (abdomen).   No driving for 24 hours (because of the medicine (anesthesia) used during the test).    Do not sign any important legal documents or operate any machinery for 24 hours (because of the anesthesia used during the test).  NUTRITION  Drink plenty of fluids.   You may resume your normal diet as instructed by your doctor.   Begin with a light meal and progress to your normal diet. Heavy or fried foods are harder to digest and may make you feel sick to your stomach (nauseated).   Avoid alcoholic beverages for 24 hours or as instructed.  MEDICATIONS  You may resume your normal medications unless your doctor tells you otherwise.  WHAT YOU CAN EXPECT TODAY  Some feelings of bloating in the abdomen.   Passage of more gas than usual.   Spotting of blood in your stool or on the toilet paper.  IF YOU HAD POLYPS REMOVED DURING THE COLONOSCOPY:  No aspirin products for 7 days or as instructed.   No alcohol for 7 days or as instructed.   Eat a soft diet for the next 24 hours.  FINDING OUT THE RESULTS OF YOUR TEST Not all test results are available during your visit. If your test results are not back during the visit, make an appointment  with your caregiver to find out the results. Do not assume everything is normal if you have not heard from your caregiver or the medical facility. It is important for you to follow up on all of your test results.  SEEK IMMEDIATE MEDICAL ATTENTION IF:  You have more than a spotting of blood in your stool.   Your belly is swollen (abdominal distention).   You are nauseated or vomiting.   You have a temperature over 101.  You have abdominal pain or discomfort that is severe or gets worse throughout the day. EGD Discharge instructions Please read the instructions outlined below and refer to this sheet in the next few weeks. These discharge instructions provide you with general information on caring for yourself after you leave the hospital. Your doctor may also give you specific instructions. While your treatment has been planned according to the most current medical practices available, unavoidable complications occasionally occur. If you have any problems or questions after discharge, please call your doctor. ACTIVITY You may resume your regular activity but move at a slower pace for the next 24 hours.  Take frequent rest periods for the next 24 hours.  Walking will help expel (get rid of) the air and reduce the bloated feeling in your abdomen.  No driving for 24 hours (because of the anesthesia (medicine) used during the test).  You may shower.  Do not sign any important legal documents or operate any machinery for 24  hours (because of the anesthesia used during the test).  NUTRITION Drink plenty of fluids.  You may resume your normal diet.  Begin with a light meal and progress to your normal diet.  Avoid alcoholic beverages for 24 hours or as instructed by your caregiver.  MEDICATIONS You may resume your normal medications unless your caregiver tells you otherwise.  WHAT YOU CAN EXPECT TODAY You may experience abdominal discomfort such as a feeling of fullness or gas pains.   FOLLOW-UP Your doctor will discuss the results of your test with you.  SEEK IMMEDIATE MEDICAL ATTENTION IF ANY OF THE FOLLOWING OCCUR: Excessive nausea (feeling sick to your stomach) and/or vomiting.  Severe abdominal pain and distention (swelling).  Trouble swallowing.  Temperature over 101 F (37.8 C).  Rectal bleeding or vomiting of blood.    GERD and polyp information provided  Continue Dexilant 60 mg daily  Further recommendations to follow once pathology report is available for review Colon Polyps Polyps are lumps of extra tissue growing inside the body. Polyps can grow in the large intestine (colon). Most colon polyps are noncancerous (benign). However, some colon polyps can become cancerous over time. Polyps that are larger than a pea may be harmful. To be safe, caregivers remove and test all polyps. CAUSES  Polyps form when mutations in the genes cause your cells to grow and divide even though no more tissue is needed. RISK FACTORS There are a number of risk factors that can increase your chances of getting colon polyps. They include: Being older than 50 years. Family history of colon polyps or colon cancer. Long-term colon diseases, such as colitis or Crohn disease. Being overweight. Smoking. Being inactive. Drinking too much alcohol. SYMPTOMS  Most small polyps do not cause symptoms. If symptoms are present, they may include: Blood in the stool. The stool may look dark red or black. Constipation or diarrhea that lasts longer than 1 week. DIAGNOSIS People often do not know they have polyps until their caregiver finds them during a regular checkup. Your caregiver can use 4 tests to check for polyps: Digital rectal exam. The caregiver wears gloves and feels inside the rectum. This test would find polyps only in the rectum. Barium enema. The caregiver puts a liquid called barium into your rectum before taking X-rays of your colon. Barium makes your colon look white.  Polyps are dark, so they are easy to see in the X-ray pictures. Sigmoidoscopy. A thin, flexible tube (sigmoidoscope) is placed into your rectum. The sigmoidoscope has a light and tiny camera in it. The caregiver uses the sigmoidoscope to look at the last third of your colon. Colonoscopy. This test is like sigmoidoscopy, but the caregiver looks at the entire colon. This is the most common method for finding and removing polyps. TREATMENT  Any polyps will be removed during a sigmoidoscopy or colonoscopy. The polyps are then tested for cancer. PREVENTION  To help lower your risk of getting more colon polyps: Eat plenty of fruits and vegetables. Avoid eating fatty foods. Do not smoke. Avoid drinking alcohol. Exercise every day. Lose weight if recommended by your caregiver. Eat plenty of calcium and folate. Foods that are rich in calcium include milk, cheese, and broccoli. Foods that are rich in folate include chickpeas, kidney beans, and spinach. HOME CARE INSTRUCTIONS Keep all follow-up appointments as directed by your caregiver. You may need periodic exams to check for polyps. SEEK MEDICAL CARE IF: You notice bleeding during a bowel movement. Document Released: 06/04/2004 Document Revised:  12/01/2011 Document Reviewed: 11/18/2011 [employer] Patient Information 2014 [place], [place].      Gastroesophageal Reflux Disease, Adult Gastroesophageal reflux disease (GERD) happens when acid from your stomach flows up into the esophagus. When acid comes in contact with the esophagus, the acid causes soreness (inflammation) in the esophagus. Over time, GERD may create small holes (ulcers) in the lining of the esophagus. CAUSES  Increased body weight. This puts pressure on the stomach, making acid rise from the stomach into the esophagus. Smoking. This increases acid production in the stomach. Drinking alcohol. This causes decreased pressure in the lower esophageal sphincter (valve or ring of muscle  between the esophagus and stomach), allowing acid from the stomach into the esophagus. Late evening meals and a full stomach. This increases pressure and acid production in the stomach. A malformed lower esophageal sphincter. Sometimes, no cause is found. SYMPTOMS  Burning pain in the lower part of the mid-chest behind the breastbone and in the mid-stomach area. This may occur twice a week or more often. Trouble swallowing. Sore throat. Dry cough. Asthma-like symptoms including chest tightness, shortness of breath, or wheezing. DIAGNOSIS  Your caregiver may be able to diagnose GERD based on your symptoms. In some cases, X-rays and other tests may be done to check for complications or to check the condition of your stomach and esophagus. TREATMENT  Your caregiver may recommend over-the-counter or prescription medicines to help decrease acid production. Ask your caregiver before starting or adding any new medicines.  HOME CARE INSTRUCTIONS  Change the factors that you can control. Ask your caregiver for guidance concerning weight loss, quitting smoking, and alcohol consumption. Avoid foods and drinks that make your symptoms worse, such as: Caffeine or alcoholic drinks. Chocolate. Peppermint or mint flavorings. Garlic and onions. Spicy foods. Citrus fruits, such as oranges, lemons, or limes. Tomato-based foods such as sauce, chili, salsa, and pizza. Fried and fatty foods. Avoid lying down for the 3 hours prior to your bedtime or prior to taking a nap. Eat small, frequent meals instead of large meals. Wear loose-fitting clothing. Do not wear anything tight around your waist that causes pressure on your stomach. Raise the head of your bed 6 to 8 inches with wood blocks to help you sleep. Extra pillows will not help. Only take over-the-counter or prescription medicines for pain, discomfort, or fever as directed by your caregiver. Do not take aspirin, ibuprofen, or other nonsteroidal  anti-inflammatory drugs (NSAIDs). SEEK IMMEDIATE MEDICAL CARE IF:  You have pain in your arms, neck, jaw, teeth, or back. Your pain increases or changes in intensity or duration. You develop nausea, vomiting, or sweating (diaphoresis). You develop shortness of breath, or you faint. Your vomit is green, yellow, black, or looks like coffee grounds or blood. Your stool is red, bloody, or black. These symptoms could be signs of other problems, such as heart disease, gastric bleeding, or esophageal bleeding. MAKE SURE YOU:  Understand these instructions. Will watch your condition. Will get help right away if you are not doing well or get worse. Document Released: 06/18/2005 Document Revised: 12/01/2011 Document Reviewed: 03/28/2011 [facility] Patient Information 2014 [place], [place].

## 2013-11-21 NOTE — Interval H&P Note (Signed)
History and Physical Interval Note:  11/21/2013 7:35 AM  Joe Higgins  has presented today for surgery, with the diagnosis of SCREENING COLONOSCOPY AND ESOPHAGEAL DYSPHAGIA  The various methods of treatment have been discussed with the patient and family. After consideration of risks, benefits and other options for treatment, the patient has consented to  Procedure(s) with comments: COLONOSCOPY, ESOPHAGOGASTRODUODENOSCOPY (EGD) AND ESOPHAGEAL DILATION (ED) (N/A) - 730 as a surgical intervention .  The patient's history has been reviewed, patient examined, no change in status, stable for surgery.  I have reviewed the patient's chart and labs.  Questions were answered to the patient's satisfaction.     No change. EGD with esophageal dilation as appropriate and colonoscopy per plan.  Manus Rudd

## 2013-11-21 NOTE — Op Note (Signed)
[facility] [address] [place], [address]   COLONOSCOPY PROCEDURE REPORT  PATIENT: [name], [name]  MR#:         [id number] BIRTHDATE: [birth year] , 52  yrs. old GENDER: Male ENDOSCOPIST: R.  Garfield Cornea, MD [employer] REFERRED BY:  Mercie Eon, N.P. PROCEDURE DATE:  11/21/2013 PROCEDURE:     Ileocolonoscopy with snare polypectomy  INDICATIONS: First-ever average risk screening colonoscopy  INFORMED CONSENT:  The risks, benefits, alternatives and imponderables including but not limited to bleeding, perforation as well as the possibility of a missed lesion have been reviewed.  The potential for biopsy, lesion removal, etc. have also been discussed.  Questions have been answered.  All parties agreeable. Please see the history and physical in the medical record for more information.  MEDICATIONS: Versed 5 mg IV and Demerol 125 mg IV in divided doses  DESCRIPTION OF PROCEDURE:  After a digital rectal exam was performed, the EC-[id number] ([id number])  colonoscope was advanced from the anus through the rectum and colon to the area of the cecum, ileocecal valve and appendiceal orifice.  The cecum was deeply intubated.  These structures were well-seen and photographed for the record.  From the level of the cecum and ileocecal valve, the scope was slowly and cautiously withdrawn.  The mucosal surfaces were carefully surveyed utilizing scope tip deflection to facilitate fold flattening as needed.  The scope was pulled down into the rectum where a thorough examination including retroflexion was performed.    FINDINGS:  Adequate preparation. Single anal papilla and internal hemorrhoids tag; otherwise, the remainder of the rectal mucosa appeared normal. The patient had (1) 4 mm polyp in the mid sigmoid segment; otherwise, the remainder of the colonic mucosa appeared normal. The distal 5 cm of terminal ileum mucosa also appeared normal.  THERAPEUTIC / DIAGNOSTIC  MANEUVERS PERFORMED:  The above-mentioned polyps cold snare removed  COMPLICATIONS: None  CECAL WITHDRAWAL TIME:  9 minutes  IMPRESSION:  Colonic polyp-removed as described above  RECOMMENDATIONS: Followup on pathology. See EGD report.   _______________________________ eSigned:  R. Garfield Cornea, MD FACP [facility] 11/21/2013 8:23 AM   CC:    PATIENT NAME:  Joe Higgins, Joe Higgins MR#: [id number]

## 2013-11-21 NOTE — H&P (View-Only) (Signed)
Primary Care Physician:  Used to be Mercie Eon, NP (no current PCP) Primary GI: Dr. Gala Romney   Chief Complaint  Patient presents with  . Follow-up    HPI:   Joe Higgins presents today to schedule repeat EGD with dilation and initial screening colonoscopy. Was seen emergently in Dec 2014 secondary to food impaction; underwent EGD for food removal. Esophagitis noted. Notes chronic hx of dysphagia, at least 2-3 years. Sometimes feels worse than others. Difficult with solid foods, tough textures, breads. When flares even has issues with liquids. No pill dysphagia. Hx of GERD. Celesta Gentile said did much better with Dexilant daily. Ran out recently. Has dry cough in the morning. No abdominal pain. No N/V. No constipation, diarrhea. No rectal bleeding.    Past Medical History  Diagnosis Date  . Nephrolithiasis LEFT  . Hematuria   . Dysuria   . Mild acid reflux WATCHES DIET  . History of pneumothorax     Past Surgical History  Procedure Laterality Date  . Anal fissure repair  08-11-2006  . Cysto/ retrograde pyelogram/ left ureteroscopy/ left ureteral stent placement  02-20-2012  . Esophagogastroduodenoscopy N/A 08/25/2013    JOA:[id number] food impaction, s/p removal. esophagitis on path    Current Outpatient Prescriptions  Medication Sig Dispense Refill  . Multiple Vitamins-Minerals (MULTIVITAMIN WITH MINERALS) tablet Take 1 tablet by mouth daily.      Marland Kitchen dexlansoprazole (DEXILANT) 60 MG capsule Take 1 capsule (60 mg total) by mouth daily.  30 capsule  11   No current facility-administered medications for this visit.    Allergies as of 10/26/2013  . (No Known Allergies)    Family History  Problem Relation Age of Onset  . Colon cancer Neg Hx     History   Social History  . Marital Status: Single    Spouse Name: N/A    Number of Children: N/A  . Years of Education: N/A   Occupational History  . self-employed     back hoe   Social History Main Topics  . Smoking status:  Never Smoker   . Smokeless tobacco: Never Used  . Alcohol Use: No  . Drug Use: No  . Sexual Activity: None   Other Topics Concern  . None   Social History Narrative  . None    Review of Systems: Gen: Denies fever, chills, anorexia. Denies fatigue, weakness, weight loss.  CV: Denies chest pain, palpitations, syncope, peripheral edema, and claudication. Resp: Denies dyspnea at rest, cough, wheezing, coughing up blood, and pleurisy. GI: see HPI Derm: Denies rash, itching, dry skin Psych: Denies depression, anxiety, memory loss, confusion. No homicidal or suicidal ideation.  Heme: Denies bruising, bleeding, and enlarged lymph nodes.  Physical Exam: BP 126/74  Pulse 64  Temp(Src) 97.4 F (36.3 C) (Oral)  Wt 156 lb 9.6 oz (71.033 kg) General:   Alert and oriented. No distress noted. Pleasant and cooperative.  Head:  Normocephalic and atraumatic. Eyes:  Conjuctiva clear without scleral icterus. Mouth:  Oral mucosa pink and moist. Good dentition. No lesions. Neck:  Supple, without mass or thyromegaly. Heart:  S1, S2 present without murmurs, rubs, or gallops. Regular rate and rhythm. Abdomen:  +BS, soft, non-tender and non-distended. No rebound or guarding. No HSM or masses noted. Msk:  Symmetrical without gross deformities. Normal posture. Pulses:  2+ DP noted bilaterally Extremities:  Without edema. Neurologic:  Alert and  oriented x4;  grossly normal neurologically. Skin:  Intact without significant lesions or rashes. Cervical Nodes:  No significant  cervical adenopathy. Psych:  Alert and cooperative. Normal mood and affect.

## 2013-11-21 NOTE — Op Note (Signed)
[facility] [address] [place], [address]   ENDOSCOPY PROCEDURE REPORT  PATIENT: Joe Higgins, Joe Higgins  MR#: [id number] BIRTHDATE: 05/16/1961 , 76  yrs. old GENDER: Male ENDOSCOPIST: R.  Garfield Cornea, MD [facility] REFERRED BY:  Mercie Eon, N.P. PROCEDURE DATE:  11/21/2013 PROCEDURE:     EGD with Venia Minks dilation, esophageal biopsy.  INDICATIONS:     GERD; recent esophageal dysphagia and food impaction. Esophageal biopsies previously negative EOE.  INFORMED CONSENT:   The risks, benefits, limitations, alternatives and imponderables have been discussed.  The potential for biopsy, esophogeal dilation, etc. have also been reviewed.  Questions have been answered.  All parties agreeable.  Please see the history and physical in the medical record for more information.  MEDICATIONS: Versed 5 mg IV and Demerol 125 mg IV In divided doses.. Zofran 4 mg IV. Xylocaine gel orally   DESCRIPTION OF PROCEDURE:   The [id number] ([id number])  endoscope was introduced through the mouth and advanced to the second portion of the duodenum without difficulty or limitations.  The mucosal surfaces were surveyed very carefully during advancement of the scope and upon withdrawal.  Retroflexion view of the proximal stomach and esophagogastric junction was performed.      FINDINGS:    Somewhat "ringed" esophagus with some longitudinal furrowing. Overlying mucosa otherwise appeared normal. The tubular esophagus appeared patent throughout its course. Esophagitis previously seen no longer present. The scope traversed the tubular esophagus without any resistance whatsoever. Stomach empty. Normal gastric mucosa. Patent pylorus. Normal first and second portion of the duodenum.  THERAPEUTIC / DIAGNOSTIC MANEUVERS PERFORMED:  A 54 French Maloney dilator was passed to three quarters insertion. At the three quarters point I ran into acute resistance. I backed off. I went back in with the scope;  there was no trauma related to passage of the [facility] dilator. Subsequently, biopsies of the mid and distal esophageal taken for histologic study.   COMPLICATIONS:  None  IMPRESSION:   Somewhat abnormal appearing tubular esophagus as described above status post passage of a Maloney dilator in the subsequent biopsy otherwise normal EGD.    RECOMMENDATIONS:   Continue Dexilant 60 mg daily. Followup on pathology.    _______________________________ R. Garfield Cornea, MD FACP [facility] eSigned:  R. Garfield Cornea, MD FACP [facility] 11/21/2013 8:02 AM     CC:  PATIENT NAME:  Zacarias, Krauter MR#: [id number]

## 2013-11-23 ENCOUNTER — Telehealth: Payer: Self-pay

## 2013-11-23 ENCOUNTER — Encounter: Payer: Self-pay | Admitting: Internal Medicine

## 2013-11-23 NOTE — Telephone Encounter (Signed)
No PCP on File

## 2013-11-23 NOTE — Telephone Encounter (Signed)
Letter from: Daneil Dolin  Reason for Letter: Results Review  Send letter to patient.  Send copy of letter with path to referring provider and PCP.   Needs literature on EOE; Needs to see the extender that originally saw him in about 3 months

## 2013-11-23 NOTE — Telephone Encounter (Signed)
Letter and EOE information mailed to pt.

## 2013-11-25 ENCOUNTER — Encounter (HOSPITAL_COMMUNITY): Payer: Self-pay | Admitting: Internal Medicine

## 2013-12-05 NOTE — Telephone Encounter (Signed)
Reminder in epic °

## 2014-02-03 ENCOUNTER — Encounter (HOSPITAL_COMMUNITY): Payer: Self-pay | Admitting: Emergency Medicine

## 2014-02-03 ENCOUNTER — Emergency Department (HOSPITAL_COMMUNITY)
Admission: EM | Admit: 2014-02-03 | Discharge: 2014-02-04 | Disposition: A | Discharge status: Home / Self Care | Payer: BC Managed Care – PPO | Attending: Emergency Medicine | Admitting: Emergency Medicine

## 2014-02-03 ENCOUNTER — Emergency Department (HOSPITAL_COMMUNITY): Payer: BC Managed Care – PPO

## 2014-02-03 DIAGNOSIS — Z87442 Personal history of urinary calculi: Secondary | ICD-10-CM | POA: Insufficient documentation

## 2014-02-03 DIAGNOSIS — J322 Chronic ethmoidal sinusitis: Secondary | ICD-10-CM | POA: Insufficient documentation

## 2014-02-03 DIAGNOSIS — K219 Gastro-esophageal reflux disease without esophagitis: Secondary | ICD-10-CM | POA: Insufficient documentation

## 2014-02-03 DIAGNOSIS — Z87448 Personal history of other diseases of urinary system: Secondary | ICD-10-CM | POA: Insufficient documentation

## 2014-02-03 DIAGNOSIS — IMO0002 Reserved for concepts with insufficient information to code with codable children: Secondary | ICD-10-CM | POA: Insufficient documentation

## 2014-02-03 DIAGNOSIS — R51 Headache: Secondary | ICD-10-CM

## 2014-02-03 DIAGNOSIS — H9209 Otalgia, unspecified ear: Secondary | ICD-10-CM | POA: Insufficient documentation

## 2014-02-03 DIAGNOSIS — Z79899 Other long term (current) drug therapy: Secondary | ICD-10-CM | POA: Insufficient documentation

## 2014-02-03 DIAGNOSIS — R519 Headache, unspecified: Secondary | ICD-10-CM

## 2014-02-03 LAB — COMPREHENSIVE METABOLIC PANEL
ALT: 21 U/L (ref 0–53)
AST: 22 U/L (ref 0–37)
Albumin: 4.1 g/dL (ref 3.5–5.2)
Alkaline Phosphatase: 61 U/L (ref 39–117)
BILIRUBIN TOTAL: 0.5 mg/dL (ref 0.3–1.2)
BUN: 14 mg/dL (ref 6–23)
CALCIUM: 9.6 mg/dL (ref 8.4–10.5)
CHLORIDE: 104 meq/L (ref 96–112)
CO2: 28 meq/L (ref 19–32)
CREATININE: 1 mg/dL (ref 0.50–1.35)
GFR calc Af Amer: >90 mL/min (ref >90)
GFR, EST NON AFRICAN AMERICAN: 86 mL/min — AB (ref >90)
Glucose, Bld: 95 mg/dL (ref 70–99)
Potassium: 4 mEq/L (ref 3.7–5.3)
Sodium: 144 mEq/L (ref 137–147)
Total Protein: 7 g/dL (ref 6.0–8.3)

## 2014-02-03 LAB — CBC WITH DIFFERENTIAL/PLATELET
Basophils Absolute: 0 10*3/uL (ref 0.0–0.1)
Basophils Relative: 1 % (ref 0–1)
EOS PCT: 4 % (ref 0–5)
Eosinophils Absolute: 0.2 10*3/uL (ref 0.0–0.7)
HCT: 43.9 % (ref 39.0–52.0)
HEMOGLOBIN: 15.3 g/dL (ref 13.0–17.0)
Lymphocytes Relative: 37 % (ref 12–46)
Lymphs Abs: 1.9 10*3/uL (ref 0.7–4.0)
MCH: 30.8 pg (ref 26.0–34.0)
MCHC: 34.9 g/dL (ref 30.0–36.0)
MCV: 88.5 fL (ref 78.0–100.0)
MONOS PCT: 8 % (ref 3–12)
Monocytes Absolute: 0.4 10*3/uL (ref 0.1–1.0)
NEUTROS ABS: 2.5 10*3/uL (ref 1.7–7.7)
Neutrophils Relative %: 50 % (ref 43–77)
Platelets: 295 10*3/uL (ref 150–400)
RBC: 4.96 MIL/uL (ref 4.22–5.81)
RDW: 12.7 % (ref 11.5–15.5)
WBC: 5.1 10*3/uL (ref 4.0–10.5)

## 2014-02-03 MED ORDER — TETRACAINE HCL 0.5 % OP SOLN
1.0000 [drp] | Freq: Once | OPHTHALMIC | Status: AC
  Administered 2014-02-03: 1 [drp] via OPHTHALMIC
  Filled 2014-02-03: qty 2

## 2014-02-03 NOTE — ED Notes (Signed)
c-t head requested.  Joe Higgins from c-t called could not do c-t scan under dr bednar.  Discussed with dr Eulis Foster who is ordering the c-t now

## 2014-02-03 NOTE — ED Notes (Signed)
Dr Otter at bedside  

## 2014-02-03 NOTE — ED Notes (Signed)
Pt. Reports increasing HA and ear ache for months. Was seen at urgent care today and sent here for abnormality of optic nerve.

## 2014-02-03 NOTE — ED Notes (Signed)
The pt has been having headaches for a LONG TIME. Worse since dec 2014.  Today he was seen at an urgent care in [place] and after the doctor looked into his eyes he told them to get him to an emergency room right away.  They have no idea what he saw.  The pt is alert no gait difficulty.  The pt reports that his headache is better than usual today.  He has not given any meds

## 2014-02-04 MED ORDER — BUTALBITAL-APAP-CAFFEINE 50-325-40 MG PO TABS: 1.0000 | ORAL_TABLET | Freq: Four times a day (QID) | ORAL | Status: AC | PRN

## 2014-02-04 MED ORDER — PSEUDOEPHEDRINE HCL ER 120 MG PO TB12: 120.0000 mg | ORAL_TABLET | Freq: Two times a day (BID) | ORAL | Status: DC

## 2014-02-04 MED ORDER — FLUTICASONE PROPIONATE 50 MCG/ACT NA SUSP: 2.0000 | Freq: Every day | NASAL | Status: DC

## 2014-02-04 NOTE — ED Provider Notes (Signed)
CSN: [id number]     Arrival date & time 02/03/14  2028 History   First MD Initiated Contact with Patient 02/03/14 2310     Chief Complaint  Patient presents with  . Headache     (Consider location/radiation/quality/duration/timing/severity/associated sxs/prior Treatment) HPI 53 yo male presents to the ER with complaint of headache, right ear pain.  Pt was sent to ER from urgent care due to abnormal eye exam.  No paperwork from urgent care available, pt and wife do not know what was seen on exam.  They were told that combination of headache, ear pain, and abnormal eye exam needed emergent head ct and evaluation.  Pt has had headaches on and off for over a year.  Headaches are severe at times, usually above right eye, sometime associated with n/v, photophobia.  He has had right ear pain for the last 3 months.  He noticed some lymph nodes in his right axilla over the last few days.  He went to urgent care on prompting from his wife.  His headache today is mild.  No head injury reported.  No change in vision or hearing.  No temporal pain.  No nausea, vomiting.  No weakness, numbness, change in mental status, or other neuro changes. Past Medical History  Diagnosis Date  . Nephrolithiasis LEFT  . Hematuria   . Dysuria   . Mild acid reflux WATCHES DIET  . History of pneumothorax   . Dysphagia    Past Surgical History  Procedure Laterality Date  . Anal fissure repair  08-11-2006  . Cysto/ retrograde pyelogram/ left ureteroscopy/ left ureteral stent placement  02-20-2012  . Esophagogastroduodenoscopy N/A 08/25/2013    WUJ:[id number] food impaction, s/p removal. esophagitis on path  . Colonoscopy, esophagogastroduodenoscopy (egd) and esophageal dilation N/A 11/21/2013    Procedure: COLONOSCOPY, ESOPHAGOGASTRODUODENOSCOPY (EGD) AND ESOPHAGEAL DILATION (ED);  Surgeon: Daneil Dolin, MD;  Location: [facility];  Service: Endoscopy;  Laterality: N/A;  730   Family History  Problem Relation Age of  Onset  . Colon cancer Neg Hx    History  Substance Use Topics  . Smoking status: Never Smoker   . Smokeless tobacco: Never Used  . Alcohol Use: No    Review of Systems  All other systems reviewed and are negative.     Allergies  Review of patient's allergies indicates no known allergies.  Home Medications   Prior to Admission medications   Medication Sig Start Date End Date Taking? Authorizing Provider  acetaminophen (TYLENOL) 500 MG tablet Take 1,000 mg by mouth every 6 (six) hours as needed for mild pain.   Yes Historical Provider, MD  dexlansoprazole (DEXILANT) 60 MG capsule Take 1 capsule (60 mg total) by mouth daily. 08/25/13  Yes Daneil Dolin, MD  butalbital-acetaminophen-caffeine (FIORICET) [id number] MG per tablet Take 1-2 tablets by mouth every 6 (six) hours as needed for headache. 02/04/14 02/04/15  Kalman Drape, MD  fluticasone [facility]) 50 MCG/ACT nasal spray Place 2 sprays into both nostrils daily. 02/04/14   Kalman Drape, MD  pseudoephedrine (SUDAFED 12 HOUR) 120 MG 12 hr tablet Take 1 tablet (120 mg total) by mouth 2 (two) times daily. 02/04/14   Kalman Drape, MD   BP 129/81  Pulse 63  Temp(Src) 97.3 F (36.3 C) (Oral)  Resp 18  SpO2 98% Physical Exam  Nursing note and vitals reviewed. Constitutional: He is oriented to person, place, and time. He appears well-developed and well-nourished.  HENT:  Head: Normocephalic and atraumatic.  Right Ear: External ear normal.  Left Ear: External ear normal.  Nose: Nose normal.  Mouth/Throat: Oropharynx is clear and moist.  TM on left retracted.  TM on right normal  Eyes: Conjunctivae and EOM are normal. Pupils are equal, round, and reactive to light. Right eye exhibits no chemosis, no discharge, no exudate and no hordeolum. No foreign body present in the right eye. Left eye exhibits no chemosis, no discharge, no exudate and no hordeolum. No foreign body present in the left eye. Right conjunctiva is not injected. Right  conjunctiva has no hemorrhage. Left conjunctiva is not injected. Left conjunctiva has no hemorrhage. No scleral icterus. Right eye exhibits normal extraocular motion and no nystagmus. Left eye exhibits normal extraocular motion and no nystagmus.  Fundoscopic exam:      The right eye shows no arteriolar narrowing, no AV nicking, no exudate, no hemorrhage and no papilledema.       The left eye shows no arteriolar narrowing, no AV nicking, no exudate, no hemorrhage and no papilledema.  Panoptic used for fundoscopic exam.  Disc margins sharp, no abnormalities noted.  Pressure in right eye 10, pressure in left eye 9  Neck: Normal range of motion. Neck supple. No JVD present. No tracheal deviation present. No thyromegaly present.  Cardiovascular: Normal rate, regular rhythm, normal heart sounds and intact distal pulses.  Exam reveals no gallop and no friction rub.   No murmur heard. Pulmonary/Chest: Effort normal and breath sounds normal. No stridor. No respiratory distress. He has no wheezes. He has no rales. He exhibits no tenderness.  Abdominal: Soft. Bowel sounds are normal. He exhibits no distension and no mass. There is no tenderness. There is no rebound and no guarding.  Musculoskeletal: Normal range of motion. He exhibits no edema and no tenderness.  Lymphadenopathy:    He has no cervical adenopathy.  Neurological: He is oriented to person, place, and time. He has normal reflexes. No cranial nerve deficit. He exhibits normal muscle tone. Coordination normal.  Skin: Skin is dry. No rash noted. No erythema. No pallor.  Psychiatric: He has a normal mood and affect. His behavior is normal. Judgment and thought content normal.    ED Course  Procedures (including critical care time) Labs Review Labs Reviewed  COMPREHENSIVE METABOLIC PANEL - Abnormal; Notable for the following:    GFR calc non Af Amer 86 (*)    All other components within normal limits  CBC WITH DIFFERENTIAL    Imaging  Review Ct Head Wo Contrast  02/03/2014   CLINICAL DATA:  Headache.  EXAM: CT HEAD WITHOUT CONTRAST  TECHNIQUE: Contiguous axial images were obtained from the base of the skull through the vertex without intravenous contrast.  COMPARISON:  None.  FINDINGS: Normal appearing cerebral hemispheres and posterior fossa structures. Normal size and position of the ventricles. No intracranial hemorrhage, mass lesion or CT evidence of acute infarction. Bilateral ethmoid sinus mucosal thickening.  IMPRESSION: Chronic bilateral ethmoid sinusitis. Otherwise, unremarkable examination.   Electronically Signed   By: Enrique Sack M.D.   On: 02/03/2014 22:31     EKG Interpretation None      MDM   Final diagnoses:  Headache  Chronic ethmoidal sinusitis  Ear pain    53 year old male with headache for a year, ear pain for 3 months, sent to emergency room for workup after an abnormal ocular finding by urgent care provider.  His workup here has been unremarkable.  Ocular pressures are normal in both eyes.  Retinal exam  with sharp optic disc margins, no cotton-wool spots, no AV nicking.  CT scan unremarkable aside from chronic because of thickening in his ethmoid sinuses.  Right ear normal on exam.  We'll refer patient to ophthalmologist and neurologist for further workup.    Kalman Drape, MD 02/05/14 1026

## 2014-02-04 NOTE — Discharge Instructions (Signed)
Your workup today has not shown a specific cause for your ongoing headaches or ear pain.  You do have some chronic sinusitis on the CT scan.  Use the sudafed and flonase for 2 weeks.  If not improving, follow up with an ENT specialist.  Return to the ER for worsening condition or new concerning symptoms.   Headaches, Frequently Asked Questions MIGRAINE HEADACHES Q: What is migraine? What causes it? How can I treat it? A: Generally, migraine headaches begin as a dull ache. Then they develop into a constant, throbbing, and pulsating pain. You may experience pain at the temples. You may experience pain at the front or back of one or both sides of the head. The pain is usually accompanied by a combination of:  Nausea.  Vomiting.  Sensitivity to light and noise. Some people (about 15%) experience an aura (see below) before an attack. The cause of migraine is believed to be chemical reactions in the brain. Treatment for migraine may include over-the-counter or prescription medications. It may also include self-help techniques. These include relaxation training and biofeedback.  Q: What is an aura? A: About 15% of people with migraine get an "aura". This is a sign of neurological symptoms that occur before a migraine headache. You may see wavy or jagged lines, dots, or flashing lights. You might experience tunnel vision or blind spots in one or both eyes. The aura can include visual or auditory hallucinations (something imagined). It may include disruptions in smell (such as strange odors), taste or touch. Other symptoms include:  Numbness.  A "pins and needles" sensation.  Difficulty in recalling or speaking the correct word. These neurological events may last as long as 60 minutes. These symptoms will fade as the headache begins. Q: What is a trigger? A: Certain physical or environmental factors can lead to or "trigger" a migraine. These include:  Foods.  Hormonal  changes.  Weather.  Stress. It is important to remember that triggers are different for everyone. To help prevent migraine attacks, you need to figure out which triggers affect you. Keep a headache diary. This is a good way to track triggers. The diary will help you talk to your healthcare professional about your condition. Q: Does weather affect migraines? A: Bright sunshine, hot, humid conditions, and drastic changes in barometric pressure may lead to, or "trigger," a migraine attack in some people. But studies have shown that weather does not act as a trigger for everyone with migraines. Q: What is the link between migraine and hormones? A: Hormones start and regulate many of your body's functions. Hormones keep your body in balance within a constantly changing environment. The levels of hormones in your body are unbalanced at times. Examples are during menstruation, pregnancy, or menopause. That can lead to a migraine attack. In fact, about three quarters of all women with migraine report that their attacks are related to the menstrual cycle.  Q: Is there an increased risk of stroke for migraine sufferers? A: The likelihood of a migraine attack causing a stroke is very remote. That is not to say that migraine sufferers cannot have a stroke associated with their migraines. In persons under age 14, the most common associated factor for stroke is migraine headache. But over the course of a person's normal life span, the occurrence of migraine headache may actually be associated with a reduced risk of dying from cerebrovascular disease due to stroke.  Q: What are acute medications for migraine? A: Acute medications are used to  treat the pain of the headache after it has started. Examples over-the-counter medications, NSAIDs, ergots, and triptans.  Q: What are the triptans? A: Triptans are the newest class of abortive medications. They are specifically targeted to treat migraine. Triptans are  vasoconstrictors. They moderate some chemical reactions in the brain. The triptans work on receptors in your brain. Triptans help to restore the balance of a neurotransmitter called serotonin. Fluctuations in levels of serotonin are thought to be a main cause of migraine.  Q: Are over-the-counter medications for migraine effective? A: Over-the-counter, or "OTC," medications may be effective in relieving mild to moderate pain and associated symptoms of migraine. But you should see your caregiver before beginning any treatment regimen for migraine.  Q: What are preventive medications for migraine? A: Preventive medications for migraine are sometimes referred to as "prophylactic" treatments. They are used to reduce the frequency, severity, and length of migraine attacks. Examples of preventive medications include antiepileptic medications, antidepressants, beta-blockers, calcium channel blockers, and NSAIDs (nonsteroidal anti-inflammatory drugs). Q: Why are anticonvulsants used to treat migraine? A: During the past few years, there has been an increased interest in antiepileptic drugs for the prevention of migraine. They are sometimes referred to as "anticonvulsants". Both epilepsy and migraine may be caused by similar reactions in the brain.  Q: Why are antidepressants used to treat migraine? A: Antidepressants are typically used to treat people with depression. They may reduce migraine frequency by regulating chemical levels, such as serotonin, in the brain.  Q: What alternative therapies are used to treat migraine? A: The term "alternative therapies" is often used to describe treatments considered outside the scope of conventional Western medicine. Examples of alternative therapy include acupuncture, acupressure, and yoga. Another common alternative treatment is herbal therapy. Some herbs are believed to relieve headache pain. Always discuss alternative therapies with your caregiver before proceeding. Some  herbal products contain arsenic and other toxins. TENSION HEADACHES Q: What is a tension-type headache? What causes it? How can I treat it? A: Tension-type headaches occur randomly. They are often the result of temporary stress, anxiety, fatigue, or anger. Symptoms include soreness in your temples, a tightening band-like sensation around your head (a "vice-like" ache). Symptoms can also include a pulling feeling, pressure sensations, and contracting head and neck muscles. The headache begins in your forehead, temples, or the back of your head and neck. Treatment for tension-type headache may include over-the-counter or prescription medications. Treatment may also include self-help techniques such as relaxation training and biofeedback. CLUSTER HEADACHES Q: What is a cluster headache? What causes it? How can I treat it? A: Cluster headache gets its name because the attacks come in groups. The pain arrives with little, if any, warning. It is usually on one side of the head. A tearing or bloodshot eye and a runny nose on the same side of the headache may also accompany the pain. Cluster headaches are believed to be caused by chemical reactions in the brain. They have been described as the most severe and intense of any headache type. Treatment for cluster headache includes prescription medication and oxygen. SINUS HEADACHES Q: What is a sinus headache? What causes it? How can I treat it? A: When a cavity in the bones of the face and skull (a sinus) becomes inflamed, the inflammation will cause localized pain. This condition is usually the result of an allergic reaction, a tumor, or an infection. If your headache is caused by a sinus blockage, such as an infection, you will probably have  a fever. An x-ray will confirm a sinus blockage. Your caregiver's treatment might include antibiotics for the infection, as well as antihistamines or decongestants.  REBOUND HEADACHES Q: What is a rebound headache? What  causes it? How can I treat it? A: A pattern of taking acute headache medications too often can lead to a condition known as "rebound headache." A pattern of taking too much headache medication includes taking it more than 2 days per week or in excessive amounts. That means more than the label or a caregiver advises. With rebound headaches, your medications not only stop relieving pain, they actually begin to cause headaches. Doctors treat rebound headache by tapering the medication that is being overused. Sometimes your caregiver will gradually substitute a different type of treatment or medication. Stopping may be a challenge. Regularly overusing a medication increases the potential for serious side effects. Consult a caregiver if you regularly use headache medications more than 2 days per week or more than the label advises. ADDITIONAL QUESTIONS AND ANSWERS Q: What is biofeedback? A: Biofeedback is a self-help treatment. Biofeedback uses special equipment to monitor your body's involuntary physical responses. Biofeedback monitors:  Breathing.  Pulse.  Heart rate.  Temperature.  Muscle tension.  Brain activity. Biofeedback helps you refine and perfect your relaxation exercises. You learn to control the physical responses that are related to stress. Once the technique has been mastered, you do not need the equipment any more. Q: Are headaches hereditary? A: Four out of five (80%) of people that suffer report a family history of migraine. Scientists are not sure if this is genetic or a family predisposition. Despite the uncertainty, a child has a 50% chance of having migraine if one parent suffers. The child has a 75% chance if both parents suffer.  Q: Can children get headaches? A: By the time they reach high school, most young people have experienced some type of headache. Many safe and effective approaches or medications can prevent a headache from occurring or stop it after it has begun.  Q:  What type of doctor should I see to diagnose and treat my headache? A: Start with your primary caregiver. Discuss his or her experience and approach to headaches. Discuss methods of classification, diagnosis, and treatment. Your caregiver may decide to recommend you to a headache specialist, depending upon your symptoms or other physical conditions. Having diabetes, allergies, etc., may require a more comprehensive and inclusive approach to your headache. The National Headache Foundation will provide, upon request, a list of [facility] physician members in your state. Document Released: 11/29/2003 Document Revised: 12/01/2011 Document Reviewed: 05/08/2008 [facility] Patient Information 2014 [place].  Migraine Headache A migraine headache is very bad, throbbing pain on one or both sides of your head. Talk to your doctor about what things may bring on (trigger) your migraine headaches. HOME CARE  Only take medicines as told by your doctor.  Lie down in a dark, quiet room when you have a migraine.  Keep a journal to find out if certain things bring on migraine headaches. For example, write down:  What you eat and drink.  How much sleep you get.  Any change to your diet or medicines.  Lessen how much alcohol you drink.  Quit smoking if you smoke.  Get enough sleep.  Lessen any stress in your life.  Keep lights dim if bright lights bother you or make your migraines worse. GET HELP RIGHT AWAY IF:   Your migraine becomes really bad.  You have  a fever.  You have a stiff neck.  You have trouble seeing.  Your muscles are weak, or you lose muscle control.  You lose your balance or have trouble walking.  You feel like you will pass out (faint), or you pass out.  You have really bad symptoms that are different than your first symptoms. MAKE SURE YOU:   Understand these instructions.  Will watch your condition.  Will get help right away if you are not doing well or get  worse. Document Released: 06/17/2008 Document Revised: 12/01/2011 Document Reviewed: 05/16/2013 [facility] Patient Information 2014 [place].  Otalgia The most common reason for this in children is an infection of the middle ear. Pain from the middle ear is usually caused by a build-up of fluid and pressure behind the eardrum. Pain from an earache can be sharp, dull, or burning. The pain may be temporary or constant. The middle ear is connected to the nasal passages by a short narrow tube called the Eustachian tube. The Eustachian tube allows fluid to drain out of the middle ear, and helps keep the pressure in your ear equalized. CAUSES  A cold or allergy can block the Eustachian tube with inflammation and the build-up of secretions. This is especially likely in small children, because their Eustachian tube is shorter and more horizontal. When the Eustachian tube closes, the normal flow of fluid from the middle ear is stopped. Fluid can accumulate and cause stuffiness, pain, hearing loss, and an ear infection if germs start growing in this area. SYMPTOMS  The symptoms of an ear infection may include fever, ear pain, fussiness, increased crying, and irritability. Many children will have temporary and minor hearing loss during and right after an ear infection. Permanent hearing loss is rare, but the risk increases the more infections a child has. Other causes of ear pain include retained water in the outer ear canal from swimming and bathing. Ear pain in adults is less likely to be from an ear infection. Ear pain may be referred from other locations. Referred pain may be from the joint between your jaw and the skull. It may also come from a tooth problem or problems in the neck. Other causes of ear pain include:  A foreign body in the ear.  Outer ear infection.  Sinus infections.  Impacted ear wax.  Ear injury.  Arthritis of the jaw or TMJ problems.  Middle ear infection.  Tooth  infections.  Sore throat with pain to the ears. DIAGNOSIS  Your caregiver can usually make the diagnosis by examining you. Sometimes other special studies, including x-rays and lab work may be necessary. TREATMENT   If antibiotics were prescribed, use them as directed and finish them even if you or your child's symptoms seem to be improved.  Sometimes PE tubes are needed in children. These are little plastic tubes which are put into the eardrum during a simple surgical procedure. They allow fluid to drain easier and allow the pressure in the middle ear to equalize. This helps relieve the ear pain caused by pressure changes. HOME CARE INSTRUCTIONS   Only take over-the-counter or prescription medicines for pain, discomfort, or fever as directed by your caregiver. DO NOT GIVE CHILDREN ASPIRIN because of the association of Reye's Syndrome in children taking aspirin.  Use a cold pack applied to the outer ear for 15-20 minutes, 03-04 times per day or as needed may reduce pain. Do not apply ice directly to the skin. You may cause frost bite.  Over-the-counter ear drops used as directed may be effective. Your caregiver may sometimes prescribe ear drops.  Resting in an upright position may help reduce pressure in the middle ear and relieve pain.  Ear pain caused by rapidly descending from high altitudes can be relieved by swallowing or chewing gum. Allowing infants to suck on a bottle during airplane travel can help.  Do not smoke in the house or near children. If you are unable to quit smoking, smoke outside.  Control allergies. SEEK IMMEDIATE MEDICAL CARE IF:   You or your child are becoming sicker.  Pain or fever relief is not obtained with medicine.  You or your child's symptoms (pain, fever, or irritability) do not improve within 24 to 48 hours or as instructed.  Severe pain suddenly stops hurting. This may indicate a ruptured eardrum.  You or your children develop new problems such as  severe headaches, stiff neck, difficulty swallowing, or swelling of the face or around the ear. Document Released: 04/25/2004 Document Revised: 12/01/2011 Document Reviewed: 08/30/2008 [facility] Patient Information 2014 [place].  Pain of Unknown Etiology (Pain Without a Known Cause) You have come to your caregiver because of pain. Pain can occur in any part of the body. Often there is not a definite cause. If your laboratory (blood or urine) work was normal and X-rays or other studies were normal, your caregiver may treat you without knowing the cause of the pain. An example of this is the headache. Most headaches are diagnosed by taking a history. This means your caregiver asks you questions about your headaches. Your caregiver determines a treatment based on your answers. Usually testing done for headaches is normal. Often testing is not done unless there is no response to medications. Regardless of where your pain is located today, you can be given medications to make you comfortable. If no physical cause of pain can be found, most cases of pain will gradually leave as suddenly as they came.  If you have a painful condition and no reason can be found for the pain, it is important that you follow up with your caregiver. If the pain becomes worse or does not go away, it may be necessary to repeat tests and look further for a possible cause.  Only take over-the-counter or prescription medicines for pain, discomfort, or fever as directed by your caregiver.  For the protection of your privacy, test results cannot be given over the phone. Make sure you receive the results of your test. Ask how these results are to be obtained if you have not been informed. It is your responsibility to obtain your test results.  You may continue all activities unless the activities cause more pain. When the pain lessens, it is important to gradually resume normal activities. Resume activities by beginning slowly and  gradually increasing the intensity and duration of the activities or exercise. During periods of severe pain, bed rest may be helpful. Lie or sit in any position that is comfortable.  Ice used for acute (sudden) conditions may be effective. Use a large plastic bag filled with ice and wrapped in a towel. This may provide pain relief.  See your caregiver for continued problems. Your caregiver can help or refer you for exercises or physical therapy if necessary. If you were given medications for your condition, do not drive, operate machinery or power tools, or sign legal documents for 24 hours. Do not drink alcohol, take sleeping pills, or take other medications that may interfere with treatment.  See your caregiver immediately if you have pain that is becoming worse and not relieved by medications. Document Released: 06/03/2001 Document Revised: 06/29/2013 Document Reviewed: 09/08/2005 [facility] Patient Information 2014 [place].

## 2014-03-15 ENCOUNTER — Encounter: Payer: Self-pay | Admitting: Internal Medicine

## 2014-06-20 ENCOUNTER — Other Ambulatory Visit: Payer: Self-pay

## 2014-06-20 MED ORDER — PANTOPRAZOLE SODIUM 40 MG PO TBEC: 40.0000 mg | DELAYED_RELEASE_TABLET | Freq: Every day | ORAL | Status: DC

## 2018-05-03 ENCOUNTER — Encounter: Payer: Self-pay | Admitting: Pediatrics

## 2018-05-03 ENCOUNTER — Ambulatory Visit: Payer: BLUE CROSS/BLUE SHIELD | Admitting: Pediatrics

## 2018-05-03 VITALS — BP 130/85 | HR 69 | Temp 97.7°F | Ht 69.0 in | Wt 153.6 lb

## 2018-05-03 DIAGNOSIS — F419 Anxiety disorder, unspecified: Secondary | ICD-10-CM

## 2018-05-03 DIAGNOSIS — J3489 Other specified disorders of nose and nasal sinuses: Secondary | ICD-10-CM

## 2018-05-03 DIAGNOSIS — R2231 Localized swelling, mass and lump, right upper limb: Secondary | ICD-10-CM | POA: Diagnosis not present

## 2018-05-03 DIAGNOSIS — R030 Elevated blood-pressure reading, without diagnosis of hypertension: Secondary | ICD-10-CM | POA: Diagnosis not present

## 2018-05-03 DIAGNOSIS — M1990 Unspecified osteoarthritis, unspecified site: Secondary | ICD-10-CM

## 2018-05-03 LAB — CMP14+EGFR
ALK PHOS: 62 IU/L (ref 39–117)
ALT: 18 IU/L (ref 0–44)
AST: 15 IU/L (ref 0–40)
Albumin/Globulin Ratio: 1.8 (ref 1.2–2.2)
Albumin: 4.5 g/dL (ref 3.5–5.5)
BILIRUBIN TOTAL: 0.3 mg/dL (ref 0.0–1.2)
BUN / CREAT RATIO: 14 (ref 9–20)
BUN: 14 mg/dL (ref 6–24)
CO2: 25 mmol/L (ref 20–29)
Calcium: 9.8 mg/dL (ref 8.7–10.2)
Chloride: 101 mmol/L (ref 96–106)
Creatinine, Ser: 0.98 mg/dL (ref 0.76–1.27)
GFR calc Af Amer: 101 mL/min/{1.73_m2} (ref >59)
GFR calc non Af Amer: 87 mL/min/{1.73_m2} (ref >59)
GLUCOSE: 95 mg/dL (ref 65–99)
Globulin, Total: 2.5 g/dL (ref 1.5–4.5)
Potassium: 4.8 mmol/L (ref 3.5–5.2)
Sodium: 138 mmol/L (ref 134–144)
Total Protein: 7 g/dL (ref 6.0–8.5)

## 2018-05-03 LAB — CBC WITH DIFFERENTIAL/PLATELET
BASOS ABS: 0 10*3/uL (ref 0.0–0.2)
Basos: 1 %
EOS (ABSOLUTE): 0.1 10*3/uL (ref 0.0–0.4)
Eos: 2 %
Hematocrit: 44.9 % (ref 37.5–51.0)
Hemoglobin: 15.4 g/dL (ref 13.0–17.7)
Immature Grans (Abs): 0 10*3/uL (ref 0.0–0.1)
Immature Granulocytes: 0 %
Lymphocytes Absolute: 1.2 10*3/uL (ref 0.7–3.1)
Lymphs: 23 %
MCH: 30.8 pg (ref 26.6–33.0)
MCHC: 34.3 g/dL (ref 31.5–35.7)
MCV: 90 fL (ref 79–97)
Monocytes Absolute: 0.5 10*3/uL (ref 0.1–0.9)
Monocytes: 10 %
NEUTROS ABS: 3.4 10*3/uL (ref 1.4–7.0)
Neutrophils: 64 %
Platelets: 373 10*3/uL (ref 150–450)
RBC: 5 x10E6/uL (ref 4.14–5.80)
RDW: 13.1 % (ref 12.3–15.4)
WBC: 5.2 10*3/uL (ref 3.4–10.8)

## 2018-05-03 MED ORDER — MUPIROCIN 2 % EX OINT: TOPICAL_OINTMENT | CUTANEOUS | 1 refills | Status: DC

## 2018-05-03 MED ORDER — DULOXETINE HCL 20 MG PO CPEP: 20.0000 mg | ORAL_CAPSULE | Freq: Every day | ORAL | 3 refills | Status: DC

## 2018-05-03 NOTE — Progress Notes (Signed)
Subjective:   Patient ID: Joe Higgins, male    DOB: January 18, 1961, 57 y.o.   MRN: [id number] CC: New Patient (Initial Visit); Adenopathy (Right axillary); and Elbow Pain (Right)  HPI: Joe Higgins is a 57 y.o. male   He is here today with his daughter and grandson.  Right axillary lump: Has been present for at least the last 2 years.  Sometimes larger, sometimes smaller.  Never any redness or tenderness.  Right now it is on the smaller side.  A few days ago he was larger.  When it is larger he will sometimes feel numbness and tingling in his Joe Higgins and fifth fingers of his right hand.  No other lumps that he is noticed.  Anxiety: Has had a lot going on recently at home.  He is a hard time turning off his thoughts at night.  Work is been very busy but good.  He is a hard time shutting off his thoughts at night.  His mood has been down for the last few years, since breaking up with his ex-wife.  He thinks that his mood being depressed contributed to their difficulties.  Sore in his nose: Joe Higgins a couple places inside both left nare and, most recently, right nare that are sore.  He says he used a needle to pop a small abscess with return of white fluid yesterday inside his right nare.  The redness and pain is much improved today.  He works in [employer], and is around dust regularly.  Arthritis: His left elbow bothers him some off and on.  He does not take anything for the pain.  No redness or swelling in the elbow.    Past Medical History:  Diagnosis Date  . Dysphagia   . Dysuria   . Hematuria   . History of pneumothorax   . Mild acid reflux WATCHES DIET  . Nephrolithiasis LEFT   Family History  Problem Relation Age of Onset  . Hypertension Father   . Arthritis Maternal Aunt   . Cancer Maternal Aunt   . Cancer Maternal Grandmother   . Arthritis Maternal Grandfather   . Cancer Maternal Grandfather   . Colon cancer Neg Hx    Social History   Tobacco Use  . Smoking status: Never  Smoker  . Smokeless tobacco: Never Used  Substance Use Topics  . Alcohol use: Yes    Comment: 2-4 times monthly  . Drug use: No    ROS: All systems negative other than what is in HPI  Objective:    BP 130/85   Pulse 69   Temp 97.7 F (36.5 C) (Oral)   Ht _0  (1.753 m)   Wt 153 lb 9.6 oz (69.7 kg)   BMI 22.68 kg/m   Wt Readings from Last 3 Encounters:  05/03/18 153 lb 9.6 oz (69.7 kg)  11/21/13 155 lb (70.3 kg)  10/26/13 156 lb 9.6 oz (71 kg)    Gen: NAD, alert, cooperative with exam, NCAT EYES: EOMI, no conjunctival injection, or no icterus ENT:  TMs pearly gray b/l, OP without erythema, right nare septum slightly erythematous LYMPH: no cervical LAD or axillary lymphadenopathy other than mass as below. CV: NRRR, normal S1/S2, no murmur, distal pulses 2+ b/l Resp: CTABL, no wheezes, normal WOB Abd: +BS, soft, NTND.  Ext: No edema, warm Neuro: Alert and oriented, strength equal b/l UE and LE, coordination grossly normal MSK: Normal range of motion bilateral elbows.  No swelling.  No pain with supination against  resistance. Skin: Approximately 1.5 cm soft easily mobile mass right lateral axilla.  Nontender.  Assessment & Plan:  Joe Higgins was seen today for new patient (initial visit), adenopathy and elbow pain.  Diagnoses and all orders for this visit:  Axillary mass, right Given amount of time ongoing, numbness in his fingers when it is enlarged, will go ahead and get ultrasound.  Suspect lipoma.  If ongoing symptoms may need to have removed. -     [place] AXILLA RIGHT; Future -     CBC with Differential/Platelet -     CMP14+EGFR  Anxiety Ongoing symptoms.  Will start below.  Has never been on anything before. -     DULoxetine (CYMBALTA) 20 MG capsule; Take 1 capsule (20 mg total) by mouth daily.  Sore in nose Use below for the next few days.  Wear PPE as appropriate when working. -     mupirocin ointment (BACTROBAN) 2 %; Use twice a day on affected areas  Elevated  blood pressure reading Repeat next visit.  Arthritis NSAIDs , rest ice, as needed.  Follow up plan: Return in about 2 months (around 07/03/2018). Assunta Found, MD [place]

## 2018-05-10 ENCOUNTER — Other Ambulatory Visit: Payer: Self-pay | Admitting: Pediatrics

## 2018-05-10 DIAGNOSIS — N6310 Unspecified lump in the right breast, unspecified quadrant: Secondary | ICD-10-CM

## 2018-05-17 ENCOUNTER — Other Ambulatory Visit: Payer: Self-pay | Admitting: Pediatrics

## 2018-05-17 DIAGNOSIS — R928 Other abnormal and inconclusive findings on diagnostic imaging of breast: Secondary | ICD-10-CM

## 2018-05-18 ENCOUNTER — Ambulatory Visit (HOSPITAL_COMMUNITY)
Admission: RE | Admit: 2018-05-18 | Discharge: 2018-05-18 | Disposition: A | Discharge status: Home / Self Care | Payer: BLUE CROSS/BLUE SHIELD | Source: Ambulatory Visit | Attending: Pediatrics | Admitting: Pediatrics

## 2018-05-18 ENCOUNTER — Other Ambulatory Visit: Payer: Self-pay | Admitting: Pediatrics

## 2018-05-18 DIAGNOSIS — R2231 Localized swelling, mass and lump, right upper limb: Secondary | ICD-10-CM

## 2018-05-18 DIAGNOSIS — N6310 Unspecified lump in the right breast, unspecified quadrant: Secondary | ICD-10-CM

## 2018-05-18 DIAGNOSIS — R928 Other abnormal and inconclusive findings on diagnostic imaging of breast: Secondary | ICD-10-CM | POA: Diagnosis not present

## 2018-05-18 DIAGNOSIS — Z803 Family history of malignant neoplasm of breast: Secondary | ICD-10-CM | POA: Diagnosis not present

## 2018-05-25 ENCOUNTER — Telehealth: Payer: Self-pay | Admitting: Pediatrics

## 2018-05-25 ENCOUNTER — Other Ambulatory Visit: Payer: BLUE CROSS/BLUE SHIELD

## 2018-05-25 DIAGNOSIS — W57XXXA Bitten or stung by nonvenomous insect and other nonvenomous arthropods, initial encounter: Secondary | ICD-10-CM

## 2018-05-25 DIAGNOSIS — R6889 Other general symptoms and signs: Secondary | ICD-10-CM | POA: Diagnosis not present

## 2018-05-25 NOTE — Telephone Encounter (Signed)
Attempted to contact patient - NVM 

## 2018-05-25 NOTE — Telephone Encounter (Signed)
Okay to order Lymes and RMSF titers. Thanks, WS

## 2018-05-25 NOTE — Telephone Encounter (Signed)
Daughter aware and lab ordered

## 2018-05-25 NOTE — Telephone Encounter (Signed)
Daughter called- aware of lab results. Patient was seen 8/12 and had labs done. Daughter states that she wanted to have labs done for tick bites. Requesting to come in to have labs done for tick bites. Please advise - Covering PCP

## 2018-05-31 LAB — RMSF, IGG, IFA: RMSF, IGG, IFA: 1:128 {titer} — ABNORMAL HIGH

## 2018-05-31 LAB — LYME AB/WESTERN BLOT REFLEX
LYME DISEASE AB, QUANT, IGM: <0.8 index (ref 0.00–0.79)
Lyme IgG/IgM Ab: <0.91 {ISR} (ref 0.00–0.90)

## 2018-05-31 LAB — ROCKY MTN SPOTTED FVR ABS PNL(IGG+IGM)
RMSF IgG: POSITIVE — AB
RMSF IgM: 0.42 index (ref 0.00–0.89)

## 2018-06-09 DIAGNOSIS — L723 Sebaceous cyst: Secondary | ICD-10-CM | POA: Diagnosis not present

## 2018-07-29 ENCOUNTER — Encounter: Payer: Self-pay | Admitting: Pediatrics

## 2018-07-29 ENCOUNTER — Ambulatory Visit: Payer: BLUE CROSS/BLUE SHIELD | Admitting: Pediatrics

## 2018-07-29 DIAGNOSIS — F419 Anxiety disorder, unspecified: Secondary | ICD-10-CM | POA: Diagnosis not present

## 2018-07-29 MED ORDER — DULOXETINE HCL 20 MG PO CPEP: 40.0000 mg | ORAL_CAPSULE | Freq: Every day | ORAL | 11 refills | Status: DC

## 2018-07-29 NOTE — Progress Notes (Signed)
  Subjective:   Patient ID: Joe Higgins, male    DOB: 10/05/1960, 57 y.o.   MRN: [id number] CC: Medical Management of Chronic Issues  HPI: Joe Higgins is a 57 y.o. male   Anxiety: Was started on Cymbalta 3 months ago.  He says he does not always notice a difference, he says his daughter says she is never noticed a difference movement in his mood and affect.  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  Enjoying work more.  Looking forward to next steps and personal life.  Mood has been better.  Feeling safe at home.  Right axillary mass: Was seen by surgeon, likely a cyst.  No need for follow-up as long as it is not growing or changing.  Relevant past medical, surgical, family and social history reviewed. Allergies and medications reviewed and updated. Social History   Tobacco Use  Smoking Status Never Smoker  Smokeless Tobacco Never Used   ROS: Per HPI   Objective:    BP 130/82   Pulse 75   Temp 97.6 F (36.4 C) (Oral)   Ht 5\' 9"  (1.753 m)   Wt 156 lb 6.4 oz (70.9 kg)   BMI 23.10 kg/m   Wt Readings from Last 3 Encounters:  07/29/18 156 lb 6.4 oz (70.9 kg)  05/03/18 153 lb 9.6 oz (69.7 kg)  11/21/13 155 lb (70.3 kg)    Gen: NAD, alert, cooperative with exam, NCAT EYES: EOMI, no conjunctival injection, or no icterus ENT:   OP without erythema CV: NRRR, normal S1/S2, no murmur, distal pulses 2+ b/l Resp: CTABL, no wheezes, normal WOB Ext: No edema, warm Neuro: Alert and oriented, strength equal b/l UE and LE, coordination grossly normal MSK: normal muscle bulk  Assessment & Plan:  Joe Higgins was seen today for medical management of chronic issues.  Diagnoses and all orders for this visit:  Anxiety Improved, some ongoing symptoms.  Will do increase dose to 40 mg.  Take in the morning.  Return precautions discussed. -     DULoxetine (CYMBALTA) 20 MG capsule; Take 2 capsules (40 mg total) by mouth  daily.  Right axillary mass: Consider repeat ultrasound in 6 months.  Follow up plan: Return in about 6 months (around 01/27/2019). Assunta Found, MD [place]

## 2019-05-25 ENCOUNTER — Encounter (HOSPITAL_COMMUNITY): Payer: Self-pay | Admitting: Emergency Medicine

## 2019-05-25 DIAGNOSIS — R079 Chest pain, unspecified: Secondary | ICD-10-CM | POA: Diagnosis not present

## 2019-05-25 DIAGNOSIS — I7103 Dissection of thoracoabdominal aorta: Secondary | ICD-10-CM | POA: Diagnosis not present

## 2019-05-25 DIAGNOSIS — N281 Cyst of kidney, acquired: Secondary | ICD-10-CM | POA: Diagnosis not present

## 2019-07-27 ENCOUNTER — Other Ambulatory Visit: Payer: Self-pay | Admitting: *Deleted

## 2020-04-26 IMAGING — MG DIGITAL DIAGNOSTIC BILATERAL MAMMOGRAM WITH TOMO AND CAD
8 series · 8 of 24 positions shown · non-contrast
Comparison: None.

ACR Breast Density Category a: The breast tissue is almost entirely
fatty.

CLINICAL DATA: 57-year-old male describing a palpable lump within
the posterior RIGHT axilla.Family history of lymphoma.

EXAM:
DIGITAL DIAGNOSTIC BILATERAL MAMMOGRAM WITH CAD AND TOMO
ULTRASOUND RIGHT BREAST

[L CC synth-2D]
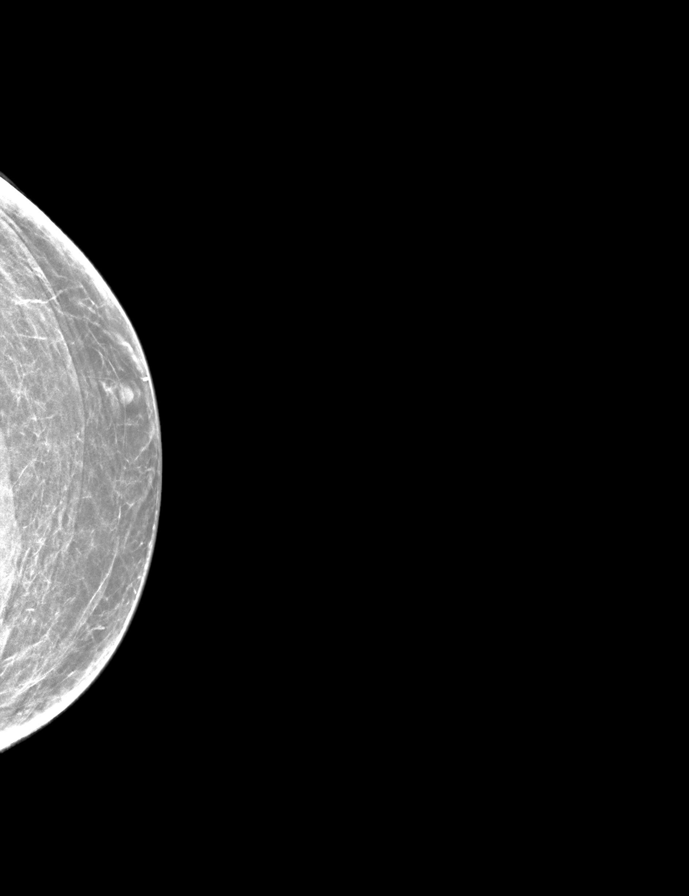

[R CC synth-2D]
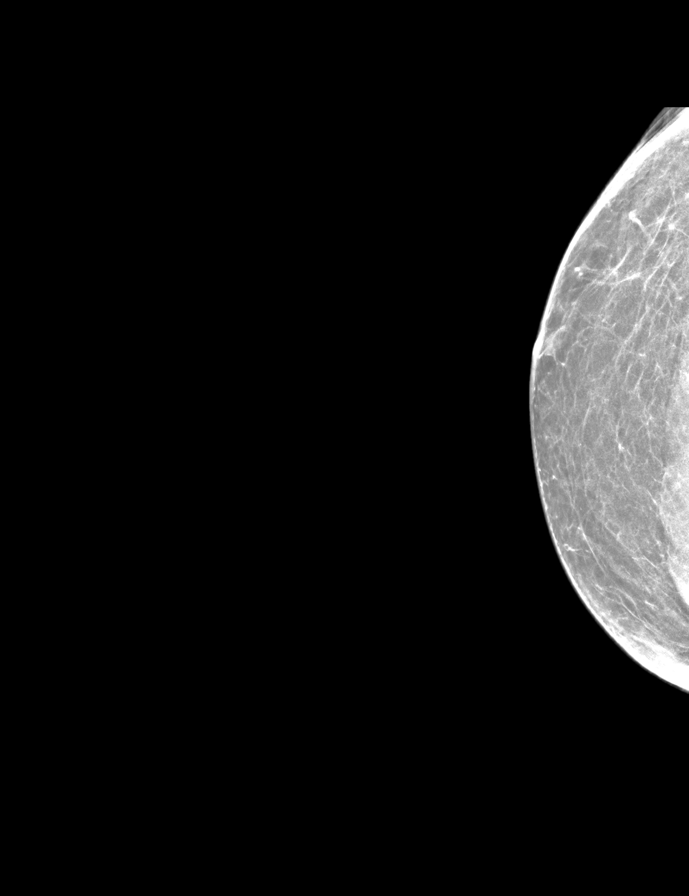

[R MLO synth-2D]
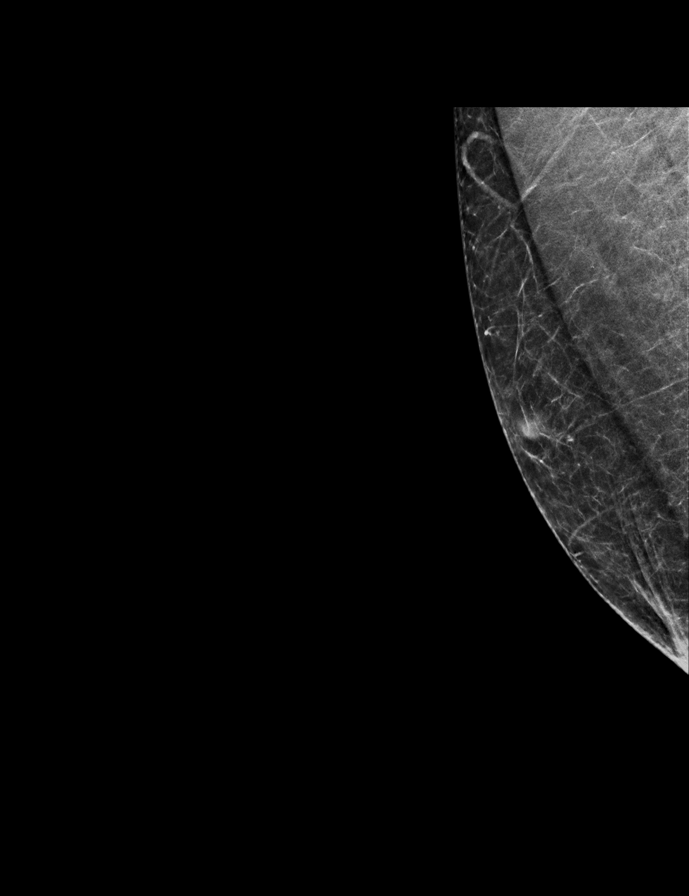

[L MLO synth-2D]
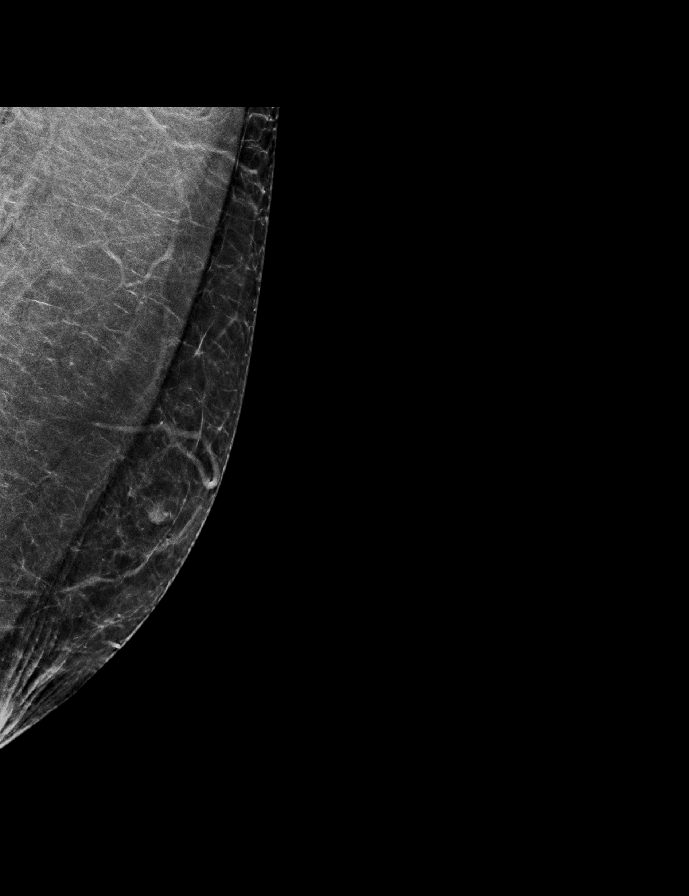

[R MLO tomo · tomo slice 23/44.0]
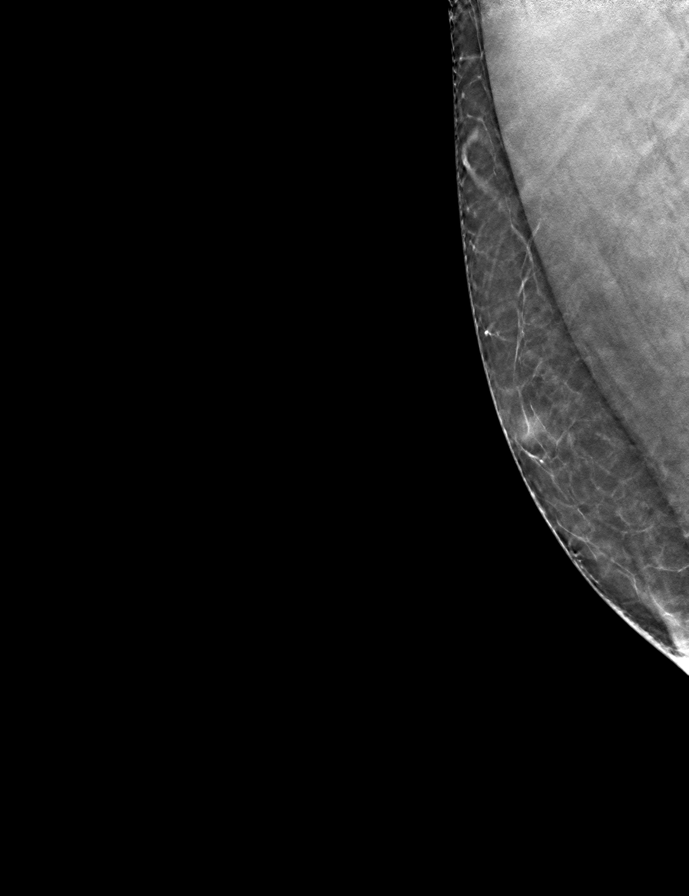

[L MLO tomo · tomo slice 25/48.0]
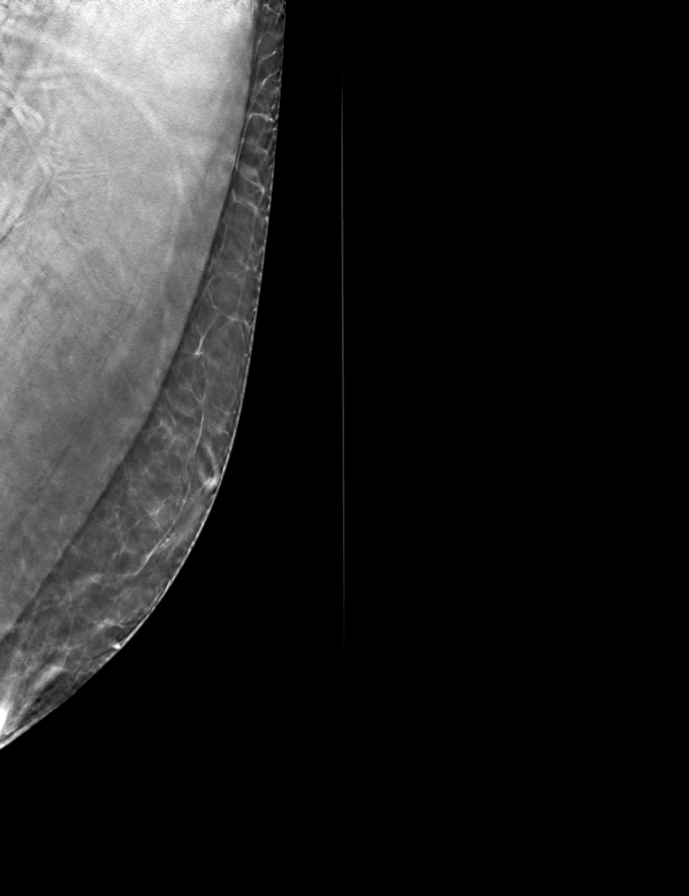

[L CC tomo · tomo slice 25/49.0]
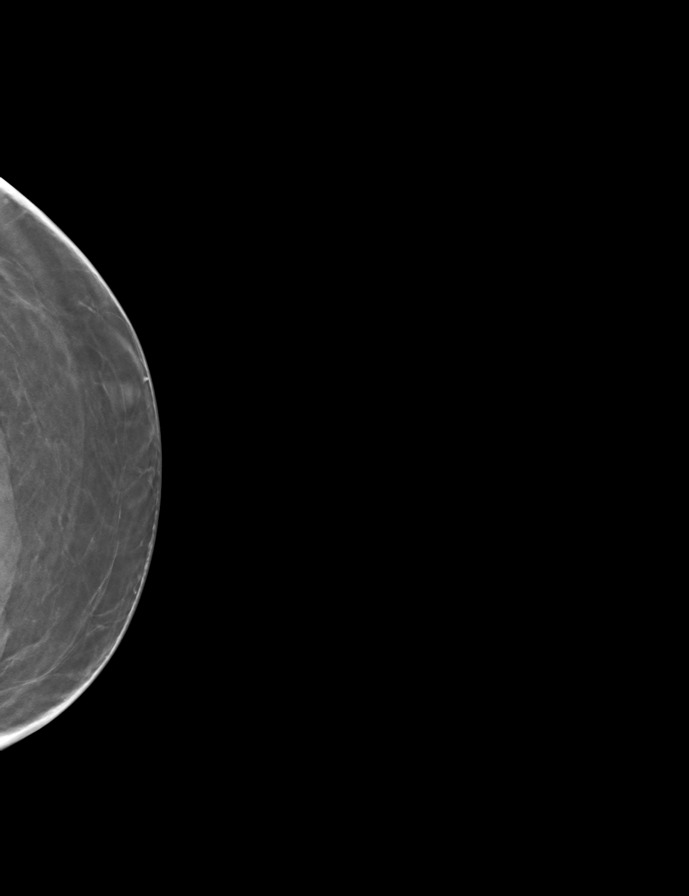

[R CC tomo · tomo slice 23/46.0]
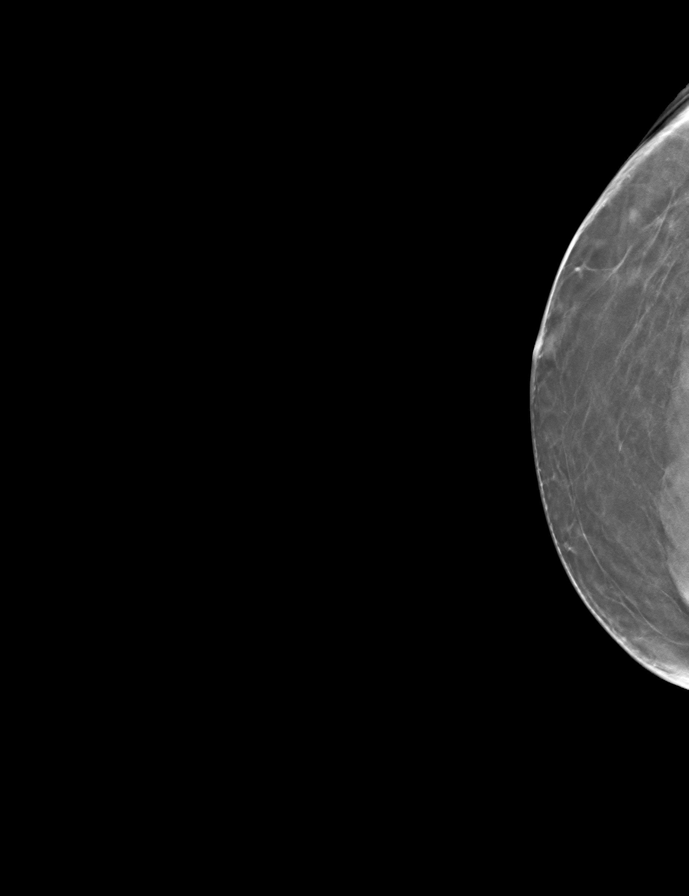

[8 of 24 positions shown; findings below may reference images not displayed]

FINDINGS: There are no suspicious masses, suspicious calcifications or
secondary signs of malignancy identified within either breast. No
evidence of gynecomastia within either breast. Visualized portions
of the RIGHT axilla appear normal.

Mammographic images were processed with CAD.

On physical exam, there is a firm palpable nodule within the RIGHT
axilla.

Targeted ultrasound is performed, evaluating the RIGHT axilla as
directed by the patient, showing a subtle/questionable hypoechoic
masslike area within the RIGHT axilla, measuring 9 x 6 mm. On the
radial projection, this masslike area is somewhat reminiscent of a
lymph node, however, a normal lymph node morphology is not confirmed
on anti-radial projection.
IMPRESSION: Probably benign hypoechoic masslike area within the RIGHT axilla,
measuring 9 x 6 mm, corresponding to the site of patient's palpable
lump, differential considerations including axillary lymph node and
complicated inclusion cyst with internal debris. No suspicious mass
or enlarged lymph nodes identified.

RECOMMENDATION:
1. Surgical referral for possible surgical excision of the palpable
area of concern.
2. If excision is not deemed appropriate by the surgeon, would
merely recommend six-month follow-up ultrasound of the RIGHT axilla
to ensure stability or resolution.

Findings and recommendations discussed with the ordering physician,
Dr. Gulati, on 05/18/2018 at [DATE] p.m.

I have discussed the findings and recommendations with the patient.
Results were also provided in writing at the conclusion of the
visit. If applicable, a reminder letter will be sent to the patient
regarding the next appointment.

BI-RADS CATEGORY  3: Probably benign.

## 2020-09-22 ENCOUNTER — Encounter: Payer: Self-pay | Admitting: Emergency Medicine

## 2020-09-22 ENCOUNTER — Ambulatory Visit
Admission: EM | Admit: 2020-09-22 | Discharge: 2020-09-22 | Disposition: A | Discharge status: Home / Self Care | Payer: No Typology Code available for payment source | Attending: Internal Medicine | Admitting: Internal Medicine

## 2020-09-22 ENCOUNTER — Other Ambulatory Visit: Payer: Self-pay

## 2020-09-22 DIAGNOSIS — L02811 Cutaneous abscess of head [any part, except face]: Secondary | ICD-10-CM | POA: Diagnosis not present

## 2020-09-22 MED ORDER — DOXYCYCLINE HYCLATE 100 MG PO CAPS: 100.0000 mg | ORAL_CAPSULE | Freq: Two times a day (BID) | ORAL | 0 refills | Status: DC

## 2020-09-22 NOTE — ED Triage Notes (Signed)
Abscess to RT  Lower back of head since Wednesday

## 2020-09-22 NOTE — ED Provider Notes (Signed)
[facility]    CSN: [id number] Arrival date & time: 09/22/20  1006      History   Chief Complaint Chief Complaint  Patient presents with  . Abscess    HPI Joe Higgins is a 60 y.o. male who presents with abscess on his lower occipital scalp which started 4 days ago. His wife had him apply heat, and she used a needle yesterday and drained puss. Today the rea is larger and more tender and she told him he needs antibiotics( she is an Therapist, sports). He denies fever, chills or sweat. Has never had abscesses  Past Medical History:  Diagnosis Date  . Dysphagia   . Dysuria   . Hematuria   . History of pneumothorax   . Mild acid reflux WATCHES DIET  . Nephrolithiasis LEFT    Patient Active Problem List   Diagnosis Date Noted  . Dysphagia, unspecified(787.20) 10/27/2013  . Encounter for screening colonoscopy 10/27/2013    Past Surgical History:  Procedure Laterality Date  . ANAL FISSURE REPAIR  08-11-2006  . COLONOSCOPY, ESOPHAGOGASTRODUODENOSCOPY (EGD) AND ESOPHAGEAL DILATION N/A 11/21/2013   Procedure: COLONOSCOPY, ESOPHAGOGASTRODUODENOSCOPY (EGD) AND ESOPHAGEAL DILATION (ED);  Surgeon: Daneil Dolin, MD;  Location: [facility];  Service: Endoscopy;  Laterality: N/A;  730  . CYSTO/ RETROGRADE PYELOGRAM/ LEFT URETEROSCOPY/ LEFT URETERAL STENT PLACEMENT  02-20-2012  . ESOPHAGOGASTRODUODENOSCOPY N/A 08/25/2013   [id number] food impaction, s/p removal. esophagitis on path     Home Medications    Prior to Admission medications   Medication Sig Start Date End Date Taking? Authorizing Provider  doxycycline (VIBRAMYCIN) 100 MG capsule Take 1 capsule (100 mg total) by mouth 2 (two) times daily. 09/22/20  Yes Rodriguez-Southworth, Sunday Spillers, PA-C  DULoxetine (CYMBALTA) 20 MG capsule Take 2 capsules (40 mg total) by mouth daily. 07/29/18 09/22/20  Eustaquio Maize, MD    Family History Family History  Problem Relation Age of Onset  . Hypertension Father   . Arthritis Maternal  Aunt   . Cancer Maternal Aunt   . Cancer Maternal Grandmother   . Arthritis Maternal Grandfather   . Cancer Maternal Grandfather   . Colon cancer Neg Hx     Social History Social History   Tobacco Use  . Smoking status: Never Smoker  . Smokeless tobacco: Never Used  Vaping Use  . Vaping Use: Never used  Substance Use Topics  . Alcohol use: Yes    Comment: 2-4 times monthly  . Drug use: No     Allergies   Patient has no known allergies.   Review of Systems Review of Systems  Constitutional: Negative for chills, diaphoresis, fatigue and fever.  Musculoskeletal: Negative for gait problem and myalgias.  Skin: Positive for color change. Negative for rash and wound.  Neurological: Negative for headaches.  Hematological: Negative for adenopathy.     Physical Exam Triage Vital Signs ED Triage Vitals  Enc Vitals Group     BP 09/22/20 1055 (!) 166/99     Pulse Rate 09/22/20 1055 82     Resp 09/22/20 1055 18     Temp 09/22/20 1055 98.2 F (36.8 C)     Temp Source 09/22/20 1055 Oral     SpO2 09/22/20 1055 98 %     Weight --      Height --      Head Circumference --      Peak Flow --      Pain Score 09/22/20 1057 5     Pain  Loc --      Pain Edu? --      Excl. in GC? --    No data found.  Updated Vital Signs BP (!) 166/99 (BP Location: Right Arm)   Pulse 82   Temp 98.2 F (36.8 C) (Oral)   Resp 18   SpO2 98%   Visual Acuity Right Eye Distance:   Left Eye Distance:   Bilateral Distance:    Right Eye Near:   Left Eye Near:    Bilateral Near:     Physical Exam Vitals and nursing note reviewed.  Constitutional:      General: He is not in acute distress.    Appearance: He is normal weight. He is not toxic-appearing.  HENT:     Right Ear: External ear normal.     Left Ear: External ear normal.  Eyes:     General: No scleral icterus.    Conjunctiva/sclera: Conjunctivae normal.  Pulmonary:     Effort: Pulmonary effort is normal.  Musculoskeletal:         General: Normal range of motion.     Cervical back: Neck supple.  Skin:    General: Skin is warm and dry.     Comments: SCALP- ans dollar size induration with erythema and scab in the center. It is not draining   Neurological:     Mental Status: He is alert and oriented to person, place, and time.     Gait: Gait normal.  Psychiatric:        Mood and Affect: Mood normal.        Behavior: Behavior normal.        Thought Content: Thought content normal.        Judgment: Judgment normal.     UC Treatments / Results  Labs (all labs ordered are listed, but only abnormal results are displayed) Labs Reviewed - No data to display  EKG   Radiology No results found.  Procedures Procedures (including critical care time)  Medications Ordered in UC Medications - No data to display  Initial Impression / Assessment and Plan / UC Course  I have reviewed the triage vital signs and the nursing notes. Abscess posterior scalp. The area is too indurated to try I&D. I placed him on Doxy as noted and advised to continue head.   Final Clinical Impressions(s) / UC Diagnoses   Final diagnoses:  Abscess, scalp   Discharge Instructions   None    ED Prescriptions    Medication Sig Dispense Auth. Provider   doxycycline (VIBRAMYCIN) 100 MG capsule Take 1 capsule (100 mg total) by mouth 2 (two) times daily. 20 capsule Rodriguez-Southworth, Nettie Elm, PA-C     PDMP not reviewed this encounter.   Garey Ham, Cordelia Poche 09/22/20 2136

## 2020-11-13 ENCOUNTER — Encounter: Payer: Self-pay | Admitting: Internal Medicine

## 2021-01-23 ENCOUNTER — Other Ambulatory Visit: Payer: Self-pay

## 2021-01-23 ENCOUNTER — Ambulatory Visit: Payer: BLUE CROSS/BLUE SHIELD | Admitting: Family Medicine

## 2021-01-25 ENCOUNTER — Encounter: Payer: Self-pay | Admitting: Pediatrics

## 2021-02-20 ENCOUNTER — Ambulatory Visit: Payer: Self-pay | Admitting: Surgery

## 2021-02-20 NOTE — H&P (Signed)
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  The bulge is reducible.  He denies any gastrointestinal or bladder symptoms.  No previous surgeries in this area.  No Known Allergies  Past Medical History:  Diagnosis Date  . Dysphagia   . Dysuria   . Hematuria   . History of pneumothorax   . Mild acid reflux WATCHES DIET  . Nephrolithiasis LEFT    Past Surgical History:  Procedure Laterality Date  . ANAL FISSURE REPAIR  08-11-2006  . COLONOSCOPY, ESOPHAGOGASTRODUODENOSCOPY (EGD) AND ESOPHAGEAL DILATION N/A 11/21/2013   Procedure: COLONOSCOPY, ESOPHAGOGASTRODUODENOSCOPY (EGD) AND ESOPHAGEAL DILATION (ED);  Surgeon: Daneil Dolin, MD;  Location: [facility];  Service: Endoscopy;  Laterality: N/A;  730  . CYSTO/ RETROGRADE PYELOGRAM/ LEFT URETEROSCOPY/ LEFT URETERAL STENT PLACEMENT  02-20-2012  . ESOPHAGOGASTRODUODENOSCOPY N/A 08/25/2013   [id number] food impaction, s/p removal. esophagitis on path    Family History  Problem Relation Age of Onset  . Hypertension Father   . Arthritis Maternal Aunt   . Cancer Maternal Aunt   . Cancer Maternal Grandmother   . Arthritis Maternal Grandfather   . Cancer Maternal Grandfather   . Colon cancer Neg Hx     Social History   Socioeconomic History  . Marital status: Legally Separated    Spouse name: Not on file  . Number of children: Not on file  . Years of education: Not on file  . Highest education level: Not on file  Occupational History  . Occupation: self-employed    Employer: [employer]    Comment: back hoe  Tobacco Use  . Smoking status: Never Smoker  . Smokeless tobacco: Never Used  Vaping Use  . Vaping Use: Never used  Substance and Sexual Activity  . Alcohol use: Yes    Comment: 2-4 times  monthly  . Drug use: No  . Sexual activity: Yes  Other Topics Concern  . Not on file  Social History Narrative  . Not on file   Social Determinants of Health   Financial Resource Strain: Not on file  Food Insecurity: Not on file  Transportation Needs: Not on file  Physical Activity: Not on file  Stress: Not on file  Social Connections: Not on file    Current Outpatient Medications on File Prior to Visit  Medication Sig Dispense Refill  . doxycycline (VIBRAMYCIN) 100 MG capsule Take 1 capsule (100 mg total) by mouth 2 (two) times daily. 20 capsule 0  . [DISCONTINUED] DULoxetine (CYMBALTA) 20 MG capsule Take 2 capsules (40 mg total) by mouth daily. 60 capsule 11   No current facility-administered medications on file prior to visit.    Review of Systems: a complete, 10pt review of systems was completed with pertinent positives and negatives as documented in the HPI  Physical Exam: Afebrile, heart rate 91, normotensive, BMI 24  Alert, well-appearing Unlabored respirations Abdomen soft and nontender There is a reducible right inguinal hernia which is nontender with no overlying skin changes.   CBC Latest Ref Rng & Units 05/03/2018 02/03/2014 08/25/2013  WBC 3.4 - 10.8 x10E3/uL 5.2 5.1 11.4(H)  Hemoglobin 13.0 - 17.7 g/dL 15.4 15.3 16.0  Hematocrit 37.5 - 51.0 % 44.9 43.9 46.0  Platelets 150 - 450 x10E3/uL 373 295 296    CMP  Latest Ref Rng & Units 05/03/2018 02/03/2014 08/25/2013  Glucose 65 - 99 mg/dL 95 95 102(H)  BUN 6 - 24 mg/dL 14 14 14   Creatinine 0.76 - 1.27 mg/dL 0.98 1.00 0.98  Sodium 134 - 144 mmol/L 138 144 139  Potassium 3.5 - 5.2 mmol/L 4.8 4.0 4.0  Chloride 96 - 106 mmol/L 101 104 102  CO2 20 - 29 mmol/L 25 28 27   Calcium 8.7 - 10.2 mg/dL 9.8 9.6 9.4  Total Protein 6.0 - 8.5 g/dL 7.0 7.0 -  Total Bilirubin 0.0 - 1.2 mg/dL 0.3 0.5 -  Alkaline Phos 39 - 117 IU/L 62 61 -  AST 0 - 40 IU/L 15 22 -  ALT 0 - 44 IU/L 18 21 -    No results found for: INR,  PROTIME  Imaging: No results found.   A/P: RIGHT INGUINAL HERNIA (K40.90) Story: I recommend an open repair. We discussed the relevant anatomy and we discussed the technique of the procedure. Discussed risks of bleeding, infection, pain, scarring, injury to structures in the area including nerves, blood vessels, bowel, bladder, risk of chronic pain, hernia recurrence, risk of seroma or hematoma, urinary retention, and risks of general anesthesia including cardiovascular, pulmonary, and thromboembolic complications. Questions were answered. Patient wishes to proceed with scheduling.    Patient Active Problem List   Diagnosis Date Noted  . Dysphagia, unspecified(787.20) 10/27/2013  . Encounter for screening colonoscopy 10/27/2013       Romana Juniper, MD [facility]  See AMION to contact appropriate on-call provider

## 2022-06-05 ENCOUNTER — Encounter: Payer: Self-pay | Admitting: General Surgery

## 2022-06-05 ENCOUNTER — Ambulatory Visit: Payer: BC Managed Care – PPO | Admitting: General Surgery

## 2022-06-05 VITALS — BP 142/83 | HR 71 | Temp 98.0°F | Resp 12 | Ht 69.0 in | Wt 164.0 lb

## 2022-06-05 DIAGNOSIS — K4090 Unilateral inguinal hernia, without obstruction or gangrene, not specified as recurrent: Secondary | ICD-10-CM | POA: Diagnosis not present

## 2022-06-05 NOTE — Progress Notes (Signed)
[facility] History and Physical  Reason for Referral:*** Referring Physician: ***  Chief Complaint   New Patient (Initial Visit)     Joe Higgins is a 61 y.o. male.  HPI: ***.  The *** started *** and has had a duration of ***.  It is associated with ***.  The *** is improved with ***, and is made worse with ***.    Quality*** Context***  Past Medical History:  Diagnosis Date  . Dysphagia   . Dysuria   . Hematuria   . History of pneumothorax   . Mild acid reflux WATCHES DIET  . Nephrolithiasis LEFT    Past Surgical History:  Procedure Laterality Date  . ANAL FISSURE REPAIR  08-11-2006  . COLONOSCOPY, ESOPHAGOGASTRODUODENOSCOPY (EGD) AND ESOPHAGEAL DILATION N/A 11/21/2013   Procedure: COLONOSCOPY, ESOPHAGOGASTRODUODENOSCOPY (EGD) AND ESOPHAGEAL DILATION (ED);  Surgeon: Daneil Dolin, MD;  Location: [facility];  Service: Endoscopy;  Laterality: N/A;  730  . CYSTO/ RETROGRADE PYELOGRAM/ LEFT URETEROSCOPY/ LEFT URETERAL STENT PLACEMENT  02-20-2012  . ESOPHAGOGASTRODUODENOSCOPY N/A 08/25/2013   [id number] food impaction, s/p removal. esophagitis on path    Family History  Problem Relation Age of Onset  . Hypertension Father   . Arthritis Maternal Aunt   . Cancer Maternal Aunt   . Cancer Maternal Grandmother   . Arthritis Maternal Grandfather   . Cancer Maternal Grandfather   . Colon cancer Neg Hx     Social History   Tobacco Use  . Smoking status: Never  . Smokeless tobacco: Never  Vaping Use  . Vaping Use: Never used  Substance Use Topics  . Alcohol use: Yes    Comment: 2-4 times monthly  . Drug use: No    Medications: {medication reviewed/display:3041432} Allergies as of 06/05/2022   No Known Allergies      Medication List        Accurate as of June 05, 2022 10:09 AM. If you have any questions, ask your nurse or doctor.          STOP taking these medications    doxycycline 100 MG capsule Commonly known as:  VIBRAMYCIN Stopped by: Virl Cagey, MD       TAKE these medications    MULTIVITAMIN ADULT PO Take by mouth.         ROS:  {Review of Systems:30496}  Blood pressure (!) 142/83, pulse 71, temperature 98 F (36.7 C), temperature source Oral, resp. rate 12, height '5\' 9"'$  (1.753 m), weight 164 lb (74.4 kg), SpO2 98 %. Physical Exam  Results: No results found for this or any previous visit (from the past 48 hour(s)).  No results found.   Assessment & Plan:  Joe Higgins is a 61 y.o. male with *** -*** -*** -Follow up ***  All questions were answered to the satisfaction of the patient and family***.  The risk and benefits of *** were discussed including but not limited to ***.  After careful consideration, Joe Higgins has decided to ***.    Virl Cagey 06/05/2022, 10:09 AM

## 2022-06-05 NOTE — Patient Instructions (Signed)
Open Hernia Repair, Adult Open hernia repair is a surgical procedure to fix a hernia. A hernia occurs when an internal organ or tissue pushes through a weak spot in the muscles along the wall of the abdomen. Hernias commonly occur in the groin and around the belly button. Most hernias tend to get worse over time. Often, surgery is done to prevent the hernia from becoming bigger, uncomfortable, or an emergency. Emergency surgery may be needed if contents of the abdomen get stuck in the opening (incarcerated hernia) or if the blood supply gets cut off (strangulated hernia). In an open repair, an incision is made in the abdomen to perform the surgery. Tell a health care provider about: Any allergies you have. All medicines you are taking, including vitamins, herbs, eye drops, creams, and over-the-counter medicines. Any problems you or family members have had with anesthetic medicines. Any blood or bone disorders you have. Any surgeries you have had. Any medical conditions you have, including any recent cold or flu (influenza)symptoms. Whether you are pregnant or may be pregnant. What are the risks? Generally, this is a safe procedure. However, problems may occur, including: Long-lasting (chronic) pain. Bleeding. Infection. Damage to the testicles. This can cause shrinking or swelling. Damage to nearby structures or organs, including the bladder, blood vessels, intestines, or nerves near the hernia. Blood clots. Trouble passing urine. Return of the hernia. What happens before the procedure? Medicines Ask your health care provider about: Changing or stopping your regular medicines. This is especially important if you are taking diabetes medicines or blood thinners. Taking medicines such as aspirin and ibuprofen. These medicines can thin your blood. Do not take these medicines unless your health care provider tells you to take them. Taking over-the-counter medicines, vitamins, herbs, and  supplements. Surgery safety Ask your health care provider: How your surgery site will be marked. What steps will be taken to help prevent infection. These steps may include: Removing hair at the surgery site. Washing skin with a germ-killing soap. Receiving antibiotic medicine. General instructions You may have an exam or testing, such as blood tests or imaging studies. Do not use any products that contain nicotine or tobacco for at least 4 weeks before the procedure. These products include cigarettes, chewing tobacco, and vaping devices, such as e-cigarettes. If you need help quitting, ask your health care provider. Let your health care provider know if you develop a cold or any infection before your surgery. If you get an infection before surgery, you may receive antibiotics to treat it. Plan to have a responsible adult take you home from the hospital or clinic. If you will be going home right after the procedure, plan to have a responsible adult care for you for the time you are told. This is important. What happens during the procedure?  An IV will be inserted into one of your veins. You will be given one or more of the following: A medicine to help you relax (sedative). A medicine to numb the area (local anesthetic). A medicine to make you fall asleep (general anesthetic). Your surgeon will make an incision over the hernia. The tissues of the hernia will be moved back into place. The edges of the hernia may be stitched (sutured) together. The opening in the abdominal muscles will be closed with stitches (sutures). Or, your surgeon will place a mesh patch made of artificial (synthetic) material over the opening. The incision will be closed with sutures, skin glue, or adhesive strips. A bandage (dressing) may  be placed over the incision. The procedure may vary among health care providers and hospitals. What happens after the procedure? Your blood pressure, heart rate, breathing rate,  and blood oxygen level will be monitored until you leave the hospital or clinic. You may be given medicine for pain. If you were given a sedative during the procedure, it can affect you for several hours. Do not drive or operate machinery until your health care provider says that it is safe. Summary Open hernia repair is a surgical procedure to fix a hernia. Hernias commonly occur in the groin and around the belly button. Emergency surgery may be needed if contents of the abdomen get stuck in the opening (incarcerated hernia) or if the blood supply gets cut off (strangulated hernia). In this procedure, an incision is made in the abdomen to perform the surgery. After the procedure, you may be given medicine for pain. This information is not intended to replace advice given to you by your health care provider. Make sure you discuss any questions you have with your health care provider. Document Revised: 04/23/2020 Document Reviewed: 04/23/2020 Elsevier Patient Education  [place]. Inguinal Hernia, Adult An inguinal hernia develops when fat or the intestines push through a weak spot in a muscle where the leg meets the lower abdomen (groin). This creates a bulge. This kind of hernia could also be: In the scrotum, if you are male. In folds of skin around the vagina, if you are male. There are three types of inguinal hernias: Hernias that can be pushed back into the abdomen (are reducible). This type rarely causes pain. Hernias that are not reducible (are incarcerated). Hernias that are not reducible and lose their blood supply (are strangulated). This type of hernia requires emergency surgery. What are the causes? This condition is caused by having a weak spot in the muscles or tissues in your groin. This develops over time. The hernia may poke through the weak spot when you suddenly strain your lower abdominal muscles, such as when you: Lift a heavy object. Strain to have a bowel  movement. Constipation can lead to straining. Cough. What increases the risk? This condition is more likely to develop in: Males. Pregnant females. People who: Are overweight. Work in jobs that require long periods of standing or heavy lifting. Have had an inguinal hernia before. Smoke or have lung disease. These factors can lead to long-term (chronic) coughing. What are the signs or symptoms? Symptoms may depend on the size of the hernia. Often, a small inguinal hernia has no symptoms. Symptoms of a larger hernia may include: A bulge in the groin area. This is easier to see when standing. It might not be visible when lying down. Pain or burning in the groin. This may get worse when lifting, straining, or coughing. A dull ache or a feeling of pressure in the groin. An unusual bulge in the scrotum, in males. Symptoms of a strangulated inguinal hernia may include: A bulge in your groin that is very painful and tender to the touch. A bulge that turns red or purple. Fever, nausea, and vomiting. Inability to have a bowel movement or to pass gas. How is this diagnosed? This condition is diagnosed based on your symptoms, your medical history, and a physical exam. Your health care provider may feel your groin area and ask you to cough. How is this treated? Treatment depends on the size of your hernia and whether you have symptoms. If you do not have symptoms, your  health care provider may have you watch your hernia carefully and have you come in for follow-up visits. If your hernia is large or if you have symptoms, you may need surgery to repair the hernia. Follow these instructions at home: Lifestyle Avoid lifting heavy objects. Avoid standing for long periods of time. Do not use any products that contain nicotine or tobacco. These products include cigarettes, chewing tobacco, and vaping devices, such as e-cigarettes. If you need help quitting, ask your health care provider. Maintain a  healthy weight. Preventing constipation You may need to take these actions to prevent or treat constipation: Drink enough fluid to keep your urine pale yellow. Take over-the-counter or prescription medicines. Eat foods that are high in fiber, such as beans, whole grains, and fresh fruits and vegetables. Limit foods that are high in fat and processed sugars, such as fried or sweet foods. General instructions You may try to push the hernia back in place by very gently pressing on it while lying down. Do not try to force the bulge back in if it will not push in easily. Watch your hernia for any changes in shape, size, or color. Get help right away if you notice any changes. Take over-the-counter and prescription medicines only as told by your health care provider. Keep all follow-up visits. This is important. Contact a health care provider if: You have a fever or chills. You develop new symptoms. Your symptoms get worse. Get help right away if: You have pain in your groin that suddenly gets worse. You have a bulge in your groin that: Suddenly gets bigger and does not get smaller. Becomes red or purple or painful to the touch. You are a man and you have a sudden pain in your scrotum, or the size of your scrotum suddenly changes. You cannot push the hernia back in place by very gently pressing on it when you are lying down. You have nausea or vomiting that does not go away. You have a fast heartbeat. You cannot have a bowel movement or pass gas. These symptoms may represent a serious problem that is an emergency. Do not wait to see if the symptoms will go away. Get medical help right away. Call your local emergency services (911 in the [place] Summary An inguinal hernia develops when fat or the intestines push through a weak spot in a muscle where your leg meets your lower abdomen (groin). This condition is caused by having a weak spot in muscles or tissues in your groin. Symptoms may depend  on the size of the hernia, and they may include pain or swelling in your groin. A small inguinal hernia often has no symptoms. Treatment may not be needed if you do not have symptoms. If you have symptoms or a large hernia, you may need surgery to repair the hernia. Avoid lifting heavy objects. Also, avoid standing for long periods of time. This information is not intended to replace advice given to you by your health care provider. Make sure you discuss any questions you have with your health care provider. Document Revised: 05/08/2020 Document Reviewed: 05/08/2020 Elsevier Patient Education  [place].
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
## 2022-06-30 ENCOUNTER — Telehealth: Payer: Self-pay

## 2022-06-30 NOTE — Telephone Encounter (Signed)
error 

## 2022-07-17 ENCOUNTER — Encounter (HOSPITAL_COMMUNITY)
Admission: RE | Admit: 2022-07-17 | Discharge: 2022-07-17 | Disposition: A | Discharge status: Home / Self Care | Payer: BC Managed Care – PPO | Source: Ambulatory Visit | Attending: General Surgery | Admitting: General Surgery

## 2022-07-17 ENCOUNTER — Encounter (HOSPITAL_COMMUNITY): Payer: Self-pay

## 2022-07-17 HISTORY — DX: Personal history of urinary calculi: Z87.442

## 2022-07-17 HISTORY — DX: Other specified postprocedural states: Z98.890

## 2022-07-17 HISTORY — DX: Depression, unspecified: F32.A

## 2022-07-21 ENCOUNTER — Other Ambulatory Visit: Payer: Self-pay

## 2022-07-21 ENCOUNTER — Ambulatory Visit (HOSPITAL_COMMUNITY): Payer: BC Managed Care – PPO | Admitting: Anesthesiology

## 2022-07-21 ENCOUNTER — Encounter (HOSPITAL_COMMUNITY): Payer: Self-pay | Admitting: General Surgery

## 2022-07-21 ENCOUNTER — Encounter (HOSPITAL_COMMUNITY)
Admission: RE | Disposition: A | Discharge status: Home / Self Care | Payer: Self-pay | Source: Home / Self Care | Attending: General Surgery

## 2022-07-21 ENCOUNTER — Ambulatory Visit (HOSPITAL_COMMUNITY)
Admission: RE | Admit: 2022-07-21 | Discharge: 2022-07-21 | Disposition: A | Discharge status: Home / Self Care | Payer: BC Managed Care – PPO | Attending: General Surgery | Admitting: General Surgery

## 2022-07-21 DIAGNOSIS — K4090 Unilateral inguinal hernia, without obstruction or gangrene, not specified as recurrent: Secondary | ICD-10-CM | POA: Diagnosis present

## 2022-07-21 HISTORY — PX: INGUINAL HERNIA REPAIR: SHX194

## 2022-07-21 SURGERY — REPAIR, HERNIA, INGUINAL, ADULT
Anesthesia: General | Site: Inguinal | Laterality: Right

## 2022-07-21 MED ORDER — BUPIVACAINE HCL (300 MG DOSE) 3 X 100 MG IL IMPL
DRUG_IMPLANT | Status: AC
  Filled 2022-07-21: qty 100

## 2022-07-21 MED ORDER — ONDANSETRON HCL 4 MG/2ML IJ SOLN: 4.0000 mg | Freq: Once | INTRAMUSCULAR | Status: DC | PRN

## 2022-07-21 MED ORDER — FENTANYL CITRATE (PF) 100 MCG/2ML IJ SOLN
INTRAMUSCULAR | Status: DC | PRN
  Administered 2022-07-21: 25 ug via INTRAVENOUS
  Administered 2022-07-21: 50 ug via INTRAVENOUS
  Administered 2022-07-21: 25 ug via INTRAVENOUS

## 2022-07-21 MED ORDER — FENTANYL CITRATE (PF) 100 MCG/2ML IJ SOLN
INTRAMUSCULAR | Status: AC
  Filled 2022-07-21: qty 2

## 2022-07-21 MED ORDER — MIDAZOLAM HCL 2 MG/2ML IJ SOLN
INTRAMUSCULAR | Status: AC
  Filled 2022-07-21: qty 2

## 2022-07-21 MED ORDER — MIDAZOLAM HCL 5 MG/5ML IJ SOLN
INTRAMUSCULAR | Status: DC | PRN
  Administered 2022-07-21: 2 mg via INTRAVENOUS

## 2022-07-21 MED ORDER — ORAL CARE MOUTH RINSE: 15.0000 mL | Freq: Once | OROMUCOSAL | Status: AC

## 2022-07-21 MED ORDER — PHENYLEPHRINE HCL (PRESSORS) 10 MG/ML IV SOLN
INTRAVENOUS | Status: DC | PRN
  Administered 2022-07-21 (×3): 100 ug via INTRAVENOUS

## 2022-07-21 MED ORDER — CHLORHEXIDINE GLUCONATE CLOTH 2 % EX PADS: 6.0000 | MEDICATED_PAD | Freq: Once | CUTANEOUS | Status: DC

## 2022-07-21 MED ORDER — MEPERIDINE HCL 50 MG/ML IJ SOLN: 6.2500 mg | INTRAMUSCULAR | Status: DC | PRN

## 2022-07-21 MED ORDER — EPHEDRINE 5 MG/ML INJ
INTRAVENOUS | Status: AC
  Filled 2022-07-21: qty 5

## 2022-07-21 MED ORDER — DEXAMETHASONE SODIUM PHOSPHATE 10 MG/ML IJ SOLN
INTRAMUSCULAR | Status: AC
  Filled 2022-07-21: qty 1

## 2022-07-21 MED ORDER — LIDOCAINE HCL (CARDIAC) PF 100 MG/5ML IV SOSY
PREFILLED_SYRINGE | INTRAVENOUS | Status: DC | PRN
  Administered 2022-07-21: 100 mg via INTRATRACHEAL

## 2022-07-21 MED ORDER — EPHEDRINE SULFATE (PRESSORS) 50 MG/ML IJ SOLN
INTRAMUSCULAR | Status: DC | PRN
  Administered 2022-07-21: 7.5 mg via INTRAVENOUS
  Administered 2022-07-21: 5 mg via INTRAVENOUS

## 2022-07-21 MED ORDER — OXYCODONE HCL 5 MG PO TABS: 5.0000 mg | ORAL_TABLET | ORAL | 0 refills | Status: DC | PRN

## 2022-07-21 MED ORDER — LIDOCAINE HCL (PF) 2 % IJ SOLN
INTRAMUSCULAR | Status: AC
  Filled 2022-07-21: qty 5

## 2022-07-21 MED ORDER — PROPOFOL 10 MG/ML IV BOLUS
INTRAVENOUS | Status: DC | PRN
  Administered 2022-07-21: 50 mg via INTRAVENOUS
  Administered 2022-07-21: 200 mg via INTRAVENOUS

## 2022-07-21 MED ORDER — BUPIVACAINE HCL (300 MG DOSE) 3 X 100 MG IL IMPL
DRUG_IMPLANT | Status: DC | PRN
  Administered 2022-07-21: 300 mg

## 2022-07-21 MED ORDER — ONDANSETRON HCL 4 MG/2ML IJ SOLN
INTRAMUSCULAR | Status: AC
  Filled 2022-07-21: qty 2

## 2022-07-21 MED ORDER — HYDROMORPHONE HCL 1 MG/ML IJ SOLN: 0.2500 mg | INTRAMUSCULAR | Status: DC | PRN

## 2022-07-21 MED ORDER — SODIUM CHLORIDE 0.9 % IR SOLN
Status: DC | PRN
  Administered 2022-07-21: 1000 mL

## 2022-07-21 MED ORDER — ONDANSETRON HCL 4 MG/2ML IJ SOLN
INTRAMUSCULAR | Status: DC | PRN
  Administered 2022-07-21: 4 mg via INTRAVENOUS

## 2022-07-21 MED ORDER — PHENYLEPHRINE 80 MCG/ML (10ML) SYRINGE FOR IV PUSH (FOR BLOOD PRESSURE SUPPORT)
PREFILLED_SYRINGE | INTRAVENOUS | Status: AC
  Filled 2022-07-21: qty 10

## 2022-07-21 MED ORDER — DEXAMETHASONE SODIUM PHOSPHATE 10 MG/ML IJ SOLN
INTRAMUSCULAR | Status: DC | PRN
  Administered 2022-07-21: 8 mg via INTRAVENOUS

## 2022-07-21 MED ORDER — LACTATED RINGERS IV SOLN: INTRAVENOUS | Status: DC

## 2022-07-21 MED ORDER — PROPOFOL 10 MG/ML IV BOLUS
INTRAVENOUS | Status: AC
  Filled 2022-07-21: qty 20

## 2022-07-21 MED ORDER — CEFAZOLIN SODIUM-DEXTROSE 2-4 GM/100ML-% IV SOLN
2.0000 g | INTRAVENOUS | Status: AC
  Administered 2022-07-21: 2 g via INTRAVENOUS
  Filled 2022-07-21: qty 100

## 2022-07-21 MED ORDER — CHLORHEXIDINE GLUCONATE 0.12 % MT SOLN
15.0000 mL | Freq: Once | OROMUCOSAL | Status: AC
  Administered 2022-07-21: 15 mL via OROMUCOSAL

## 2022-07-21 MED ORDER — ONDANSETRON HCL 4 MG PO TABS: 4.0000 mg | ORAL_TABLET | Freq: Three times a day (TID) | ORAL | 1 refills | Status: DC | PRN

## 2022-07-21 SURGICAL SUPPLY — 29 items
ADH SKN CLS APL DERMABOND .7 (GAUZE/BANDAGES/DRESSINGS) ×1
CLOTH BEACON ORANGE TIMEOUT ST (SAFETY) ×1 IMPLANT
COVER LIGHT HANDLE STERIS (MISCELLANEOUS) ×2 IMPLANT
DERMABOND ADVANCED .7 DNX12 (GAUZE/BANDAGES/DRESSINGS) ×1 IMPLANT
DRAIN PENROSE 0.5X18 (DRAIN) ×1 IMPLANT
ELECT REM PT RETURN 9FT ADLT (ELECTROSURGICAL) ×1
ELECTRODE REM PT RTRN 9FT ADLT (ELECTROSURGICAL) ×1 IMPLANT
GAUZE SPONGE 4X4 12PLY STRL (GAUZE/BANDAGES/DRESSINGS) ×1 IMPLANT
GLOVE BIO SURGEON STRL SZ 6.5 (GLOVE) ×1 IMPLANT
GLOVE BIOGEL PI IND STRL 6.5 (GLOVE) ×1 IMPLANT
GLOVE BIOGEL PI IND STRL 7.0 (GLOVE) ×2 IMPLANT
GLOVE SS BIOGEL STRL SZ 6.5 (GLOVE) IMPLANT
GLOVE SURG SS PI 6.5 STRL IVOR (GLOVE) IMPLANT
GOWN STRL REUS W/TWL LRG LVL3 (GOWN DISPOSABLE) ×3 IMPLANT
INST SET MINOR GENERAL (KITS) ×1 IMPLANT
KIT TURNOVER KIT A (KITS) ×1 IMPLANT
MANIFOLD NEPTUNE II (INSTRUMENTS) ×1 IMPLANT
MESH MARLEX PLUG MEDIUM (Mesh General) IMPLANT
NS IRRIG 1000ML POUR BTL (IV SOLUTION) ×1 IMPLANT
PACK MINOR (CUSTOM PROCEDURE TRAY) ×1 IMPLANT
PAD ARMBOARD 7.5X6 YLW CONV (MISCELLANEOUS) ×1 IMPLANT
SET BASIN LINEN APH (SET/KITS/TRAYS/PACK) ×1 IMPLANT
SOL PREP PROV IODINE SCRUB 4OZ (MISCELLANEOUS) ×1 IMPLANT
SUT MNCRL AB 4-0 PS2 18 (SUTURE) ×1 IMPLANT
SUT NOVA NAB GS-22 2 2-0 T-19 (SUTURE) IMPLANT
SUT VIC AB 2-0 CT1 27 (SUTURE) ×1
SUT VIC AB 2-0 CT1 TAPERPNT 27 (SUTURE) ×1 IMPLANT
SUT VIC AB 3-0 SH 27 (SUTURE) ×1
SUT VIC AB 3-0 SH 27X BRD (SUTURE) ×1 IMPLANT

## 2022-07-21 NOTE — Op Note (Signed)
[facility] Operative Note  07/21/22  Preoperative Diagnosis: Right inguinal hernia    Postoperative Diagnosis: Same   Procedure(s) Performed: Right inguinal hernia repair with mesh   Surgeon: Joe Matar. Constance Haw, MD   Assistants: No qualified resident was available   Anesthesia: General endotracheal   Anesthesiologist: Dr. Charna Elizabeth, MD    Specimens: None    Estimated Blood Loss: Minimal   Blood Replacement: None    Complications: None   Wound Class: Clean    Operative Indications: Joe Higgins is a 61 yo who comes in with a right inguinal hernia that is causing him issues. We discussed repair with mesh and risk of bleeding, infection, use of mesh, recurrence, injury to neurovascular structures.   Findings: Right inguinal hernia    Procedure: The patient was taken to the operating room and placed supine. General endotracheal anesthesia was induced. Intravenous antibiotics were administered per protocol.  A time out was preformed verifying the correct patient, procedure, site, positioning and implants.  The right groin and scrotum were prepared and draped in the usual sterile fashion.   An incision was made in a natural skin crease between the pubic tubercle and the anterior superior iliac spine.  The incision was deepened with electrocautery through Scarpa's and Camper's fascia until the aponeurosis of the external oblique was encountered.  This was cleaned and the external ring was exposed.  An incision was made in the midportion of the external oblique aponeurosis in the direction of its fibers. The ilioinguinal nerve was identified but it had been cut during the initial incision of the external oblique. Flaps of the external oblique were developed cephalad and inferiorly.    The cord was identified and it was gently dissented free at the pubic tubercle and encircled with a Penrose drain.  Attention was then directed at the anteromedial aspect of the cord, where  an indirect hernia sac was identified.  The sac was carefully dissected free from the cord down to the level of the internal ring.  The vas and testicular vessels were identified and protected from harm.  Once the sac was dissected free from the cords, the Penrose was placed around the cord which was retracted inferiorly out of the field of view.  The hernia was reduced into the internal ring without difficulty.  A medium Perfix Plug was placed into the defect and filled the space.  Attention was then turned to the floor of the canal, which was grossly weakened without any defined defect or sac.  The Perfix Mesh Patch was sutured to the inguinal ligament inferiorly starting at the pubic tubercle using 2-0 Novafil interrupted sutures.  The mesh was sutured superiorly to the conjoint tendon using 2-0 Novafil interrupted sutures.  Care was taken to ensure the mesh was placed in a relaxed fashion to avoid excessive tension and no neurovascular structures were caught in the repair.  Laterally the tails of the mesh were crossed and the internal ring was recreated, allowing for passage of cords without tension. Joe Higgins was layered in the repair.   Hemostasis was adequate.  The Penrose was removed.  The external oblique aponeurosis was closed with a 2-0 Vicryl suture in a running fashion, taking care to not catch the ilioinguinal nerve in the suture line.  Scarpa's fashion was closed with 3-0 Vicryl interrupted sutures. The skin was closed with a subcuticular 4-0 Monocryl suture.  Dermabond was applied.   The testis was gently pulled down into its anatomic position in the scrotum.  The patient tolerated the procedure well and was taken to the PACU in stable condition. All counts were correct at the end of the case.        Joe Labrum, MD [facility] [address] [place], [place] [address] [phone] (office)

## 2022-07-21 NOTE — Progress Notes (Signed)
[facility]  Updated his wife. Rx to [employer]. Will see 11/28. No heavy lifting > 10 lbs, excessive bending, pushing, pulling, or squatting for 6-8 weeks after surgery.   Will need to urinate before dc today.  Curlene Labrum, MD [facility] [address] [place], [place] [address] [phone] (office)

## 2022-07-21 NOTE — Anesthesia Preprocedure Evaluation (Signed)
Anesthesia Evaluation  Patient identified by MRN, date of birth, ID band Patient awake    Reviewed: Allergy & Precautions, NPO status , Patient's Chart, lab work & pertinent test results  Airway Mallampati: III  TM Distance: >3 FB Neck ROM: Full    Dental  (+) Dental Advisory Given   Pulmonary neg pulmonary ROS,    Pulmonary exam normal breath sounds clear to auscultation       Cardiovascular negative cardio ROS Normal cardiovascular exam Rhythm:Regular Rate:Normal     Neuro/Psych PSYCHIATRIC DISORDERS Depression negative neurological ROS     GI/Hepatic Neg liver ROS, GERD  Controlled,  Endo/Other  negative endocrine ROS  Renal/GU Renal disease (stones)  negative genitourinary   Musculoskeletal negative musculoskeletal ROS (+)   Abdominal   Peds negative pediatric ROS (+)  Hematology negative hematology ROS (+)   Anesthesia Other Findings H/o pneumothorax   Reproductive/Obstetrics negative OB ROS                             Anesthesia Physical Anesthesia Plan  ASA: 2  Anesthesia Plan: General   Post-op Pain Management: Dilaudid IV   Induction: Intravenous  PONV Risk Score and Plan: 3 and Ondansetron, Dexamethasone and Midazolam  Airway Management Planned: LMA  Additional Equipment:   Intra-op Plan:   Post-operative Plan: Extubation in OR  Informed Consent: I have reviewed the patients History and Physical, chart, labs and discussed the procedure including the risks, benefits and alternatives for the proposed anesthesia with the patient or authorized representative who has indicated his/her understanding and acceptance.     Dental advisory given  Plan Discussed with: CRNA and Surgeon  Anesthesia Plan Comments:         Anesthesia Quick Evaluation

## 2022-07-21 NOTE — Anesthesia Postprocedure Evaluation (Signed)
Anesthesia Post Note  Patient: Joe Higgins  Procedure(s) Performed: HERNIA REPAIR INGUINAL ADULT (Right: Inguinal)  Patient location during evaluation: Phase II Anesthesia Type: General Level of consciousness: awake and alert and oriented Pain management: pain level controlled Vital Signs Assessment: post-procedure vital signs reviewed and stable Respiratory status: spontaneous breathing, nonlabored ventilation and respiratory function stable Cardiovascular status: blood pressure returned to baseline and stable Postop Assessment: no apparent nausea or vomiting Anesthetic complications: no   No notable events documented.   Last Vitals:  Vitals:   07/21/22 1200 07/21/22 1233  BP: 114/63 (!) 130/92  Pulse: 68 97  Resp: 17 17  Temp:  36.6 C  SpO2: 100% 99%    Last Pain:  Vitals:   07/21/22 1233  TempSrc: Oral  PainSc: 0-No pain                 Arieonna Medine C Vince Ainsley

## 2022-07-21 NOTE — Anesthesia Procedure Notes (Signed)
Procedure Name: LMA Insertion Date/Time: 07/21/2022 10:30 AM  Performed by: Camillia Herter, RNPre-anesthesia Checklist: Patient identified, Emergency Drugs available, Suction available and Patient being monitored Patient Re-evaluated:Patient Re-evaluated prior to induction Oxygen Delivery Method: Circle system utilized Preoxygenation: Pre-oxygenation with 100% oxygen Induction Type: IV induction Ventilation: Mask ventilation without difficulty LMA: LMA inserted LMA Size: 4.0 Number of attempts: 1 Placement Confirmation: positive ETCO2 and breath sounds checked- equal and bilateral Tube secured with: Tape Dental Injury: Teeth and Oropharynx as per pre-operative assessment

## 2022-07-21 NOTE — Transfer of Care (Signed)
Immediate Anesthesia Transfer of Care Note  Patient: Joe Higgins  Procedure(s) Performed: HERNIA REPAIR INGUINAL ADULT (Right: Inguinal)  Patient Location: PACU  Anesthesia Type:General  Level of Consciousness: drowsy  Airway & Oxygen Therapy: Patient Spontanous Breathing  Post-op Assessment: Report given to RN and Post -op Vital signs reviewed and stable  Post vital signs: Reviewed and stable  Last Vitals:  Vitals Value Taken Time  BP 110/61 07/21/22 1149  Temp 36.6 07/21/22 1149  Pulse 72 07/21/22 1152  Resp 17 07/21/22 1152  SpO2 97 % 07/21/22 1152  Vitals shown include unvalidated device data.  Last Pain:  Vitals:   07/21/22 0946  TempSrc: Oral  PainSc: 0-No pain      Patients Stated Pain Goal: 7 (94/70/96 2836)  Complications: No notable events documented.

## 2022-07-21 NOTE — Discharge Instructions (Signed)
Discharge Instructions Hernia:  Common Complaints: Pain at the incision site is common. This will improve with time. Take your pain medications as described below. Some nausea is common and poor appetite. The main goal is to stay hydrated the first few days after surgery.  Numbness at the incision or the thigh is common.  If you start to have burning or tingling pain in your groin, this is from a nerve being pinched. Please call and we can prescribe you a different type of pain medication for nerve pain.  Bruising in the side of the repair and down into the scrotum on that side is normal, if the bruising is spreading, call the office.  Some swelling is normal, place a towel under your scrotum (folded into a square) to help with elevation of the scrotum and swelling. Ice the side for the first 72 hours.   Diet/ Activity: Diet as tolerated. You may not have an appetite, but it is important to stay hydrated. Drink 64 ounces of water a day. Your appetite will return with time.  Shower per your regular routine daily.  Do not take hot showers. Take warm showers that are less than 10 minutes. Rest and listen to your body, but do not remain in bed all day.  Walk everyday for at least 15-20 minutes. Deep cough and move around every 1-2 hours in the first few days after surgery.  Do not pick at the dermabond glue on your incision sites.  This glue film will remain in place for 1-2 weeks and will start to peel off.  Do not place lotions or balms on your incision unless instructed to specifically by Dr. Constance Haw.  Do not lift > 10 lbs, perform excessive bending, pushing, pulling, squatting for 6-8 weeks after surgery.   Pain Expectations and Narcotics: -After surgery you will have pain associated with your incisions and this is normal. The pain is muscular and nerve pain, and will get better with time. -You are encouraged and expected to take non narcotic medications like tylenol and ibuprofen (when able)  to treat pain as multiple modalities can aid with pain treatment. -Narcotics are only used when pain is severe or there is breakthrough pain. -You are not expected to have a pain score of 0 after surgery, as we cannot prevent pain. A pain score of 3-4 that allows you to be functional, move, walk, and tolerate some activity is the goal. The pain will continue to improve over the days after surgery and is dependent on your surgery. -Due to [place] law, we are only able to give a certain amount of pain medication to treat post operative pain, and we only give additional narcotics on a patient by patient basis.  -For most laparoscopic surgery, studies have shown that the majority of patients only need 10-15 narcotic pills, and for open surgeries most patients only need 15-20.   -Having appropriate expectations of pain and knowledge of pain management with non narcotics is important as we do not want anyone to become addicted to narcotic pain medication.  -Using ice packs in the first 48 hours and heating pads after 48 hours, wearing an abdominal binder (when recommended), and using over the counter medications are all ways to help with pain management.   -Simple acts like meditation and mindfulness practices after surgery can also help with pain control and research has proven the benefit of these practices.  Medication: Take tylenol and ibuprofen as needed for pain control, alternating every 4-6 hours.  Example:  Tylenol '1000mg'$  @ 6am, 12noon, 6pm, 35mdnight (Do not exceed '4000mg'$  of tylenol a day). Ibuprofen '800mg'$  @ 9am, 3pm, 9pm, 3am (Do not exceed '3600mg'$  of ibuprofen a day).  Take Roxicodone for breakthrough pain every 4 hours.  Take Colace for constipation related to narcotic pain medication. If you do not have a bowel movement in 2 days, take Miralax over the counter.  Drink plenty of water to also prevent constipation.   Contact Information: If you have questions or concerns, please call our office,  [phone] Monday- Thursday 8AM-5PM and Friday 8AM-12Noon.  If it is after hours or on the weekend, please call [facility], [phone] and ask to speak to the surgeon on call for Dr. [facility]

## 2022-07-21 NOTE — Interval H&P Note (Signed)
History and Physical Interval Note:  07/21/2022 9:54 AM  Joe Higgins  has presented today for surgery, with the diagnosis of RIGHT INGUINAL HERNIA.  The various methods of treatment have been discussed with the patient and family. After consideration of risks, benefits and other options for treatment, the patient has consented to  Procedure(s): HERNIA REPAIR INGUINAL ADULT (Right) as a surgical intervention.  The patient's history has been reviewed, patient examined, no change in status, stable for surgery.  I have reviewed the patient's chart and labs.  Questions were answered to the patient's satisfaction.     Virl Cagey

## 2022-08-04 ENCOUNTER — Encounter (HOSPITAL_COMMUNITY): Payer: Self-pay | Admitting: General Surgery

## 2022-08-19 ENCOUNTER — Ambulatory Visit (INDEPENDENT_AMBULATORY_CARE_PROVIDER_SITE_OTHER): Payer: BC Managed Care – PPO | Admitting: General Surgery

## 2022-08-19 ENCOUNTER — Encounter: Payer: Self-pay | Admitting: General Surgery

## 2022-08-19 VITALS — BP 144/101 | HR 74 | Temp 97.9°F | Resp 12 | Ht 69.0 in | Wt 169.0 lb

## 2022-08-19 DIAGNOSIS — K4090 Unilateral inguinal hernia, without obstruction or gangrene, not specified as recurrent: Secondary | ICD-10-CM

## 2022-08-19 NOTE — Patient Instructions (Addendum)
Can gradually start doing more. Do not lift over 20 lbs until after 6 weeks.

## 2022-08-19 NOTE — Progress Notes (Signed)
[facility]  Doing well. Some soreness right at the pubic bone on the right. No bruising now. Overall everything is improving.  BP (!) 144/101   Pulse 74   Temp 97.9 F (36.6 C) (Oral)   Resp 12   Ht '5\' 9"'$  (1.753 m)   Wt 169 lb (76.7 kg)   SpO2 97%   BMI 24.96 kg/m  Incision healing, minor induration, no bruising or redness  Patient s/p R inguinal hernia repair with mesh. Doing well.   Can gradually start doing more. Do not lift over 20 lbs until after 6 weeks.   Curlene Labrum, MD [facility] [address] [place], [place] [address] [phone] (office)
